# Patient Record
Sex: Male | Born: 1960 | Race: White | Hispanic: No | Marital: Married | Smoking: Never smoker
Health system: Southern US, Community
[De-identification: ages and names within clinical notes are randomized; demographics above are authoritative.]

## PROBLEM LIST (undated history)

## (undated) DIAGNOSIS — L409 Psoriasis, unspecified: Secondary | ICD-10-CM

## (undated) DIAGNOSIS — K047 Periapical abscess without sinus: Secondary | ICD-10-CM

## (undated) DIAGNOSIS — I10 Essential (primary) hypertension: Secondary | ICD-10-CM

## (undated) HISTORY — PX: OTHER SURGICAL HISTORY: SHX169

## (undated) HISTORY — DX: Essential (primary) hypertension: I10

## (undated) HISTORY — DX: Psoriasis, unspecified: L40.9

## (undated) HISTORY — PX: UPPER GI ENDOSCOPY: SHX6162

---
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
## 1988-06-14 HISTORY — PX: OTHER SURGICAL HISTORY: SHX169

## 2005-09-14 HISTORY — PX: OTHER SURGICAL HISTORY: SHX169

## 2011-11-08 ENCOUNTER — Encounter: Payer: Self-pay | Admitting: Family Medicine

## 2012-01-27 ENCOUNTER — Encounter: Payer: Self-pay | Admitting: Family Medicine

## 2012-01-27 ENCOUNTER — Ambulatory Visit (INDEPENDENT_AMBULATORY_CARE_PROVIDER_SITE_OTHER): Payer: BC Managed Care – PPO | Admitting: Family Medicine

## 2012-01-27 VITALS — BP 110/70 | HR 53 | Temp 98.3°F | Ht 74.0 in | Wt 238.4 lb

## 2012-01-27 DIAGNOSIS — I10 Essential (primary) hypertension: Secondary | ICD-10-CM | POA: Insufficient documentation

## 2012-01-27 DIAGNOSIS — L408 Other psoriasis: Secondary | ICD-10-CM

## 2012-01-27 DIAGNOSIS — L409 Psoriasis, unspecified: Secondary | ICD-10-CM

## 2012-01-27 MED ORDER — METOPROLOL TARTRATE 100 MG PO TABS: 100.0000 mg | ORAL_TABLET | Freq: Two times a day (BID) | ORAL | Status: DC

## 2012-01-27 MED ORDER — LOSARTAN POTASSIUM-HCTZ 100-12.5 MG PO TABS: 1.0000 | ORAL_TABLET | Freq: Every day | ORAL | Status: DC

## 2012-01-27 NOTE — Progress Notes (Signed)
  Subjective:    Patient ID: Richard Ware, male    DOB: 1960/03/19, 52 y.o.   MRN: [id number]  HPI New to establish.  Previous MD- Luan Pulling [facility]).  Psoriasis- new dx 1 year ago.  On [place].  Seeing Dr Sharyn Lull regularly.  Was being seen monthly but now seeing q6 months.  HTN- chronic problem, dx'd 2000 at the age of 53.  Pt was adopted so family hx is unknown.  After losing weight was able to come off meds but resumed meds w/in 2 yrs.  Currently on Norvasc, HCTZ, Cozaar, Toprol XL.  No CP, SOB, HAs, visual changes, edema.  Pt reports home BP readings will range up to 140/85 w/out any BP meds.  Very active- exercising regularly.  Pt reports he had labs done in September.   Review of Systems For ROS see HPI     Objective:   Physical Exam  Vitals reviewed. Constitutional: He is oriented to person, place, and time. He appears well-developed and well-nourished. No distress.  HENT:  Head: Normocephalic and atraumatic.  Eyes: Conjunctivae normal and EOM are normal. Pupils are equal, round, and reactive to light.  Neck: Normal range of motion. Neck supple. No thyromegaly present.  Cardiovascular: Normal rate, regular rhythm, normal heart sounds and intact distal pulses.   No murmur heard. Pulmonary/Chest: Effort normal and breath sounds normal. No respiratory distress.  Abdominal: Soft. Bowel sounds are normal. He exhibits no distension.  Musculoskeletal: He exhibits no edema.  Lymphadenopathy:    He has no cervical adenopathy.  Neurological: He is alert and oriented to person, place, and time. No cranial nerve deficit.  Skin: Skin is warm and dry.  Psychiatric: He has a normal mood and affect. His behavior is normal.          Assessment & Plan:

## 2012-01-27 NOTE — Patient Instructions (Addendum)
Follow up in 3-4 weeks to recheck BP STOP the TOPROL XL START the Metoprolol 100mg  twice daily STOP the Hydrochlorothiazide and the Losartan START the combo pill Continue the Amlodipine Call with any questions or concerns Welcome!  We're glad to have you!!!

## 2012-01-28 NOTE — Assessment & Plan Note (Signed)
New to provider, chronic for pt.  Feel that pt's BP is too tightly controlled.  Will decrease toprol in attempts to wean off entirely.  Will also combine losartan and HCTZ.  Will follow closely and continue to adjust meds prn.  Pt expressed understanding and is in agreement w/ plan.

## 2012-01-28 NOTE — Assessment & Plan Note (Signed)
New to provider, recent dx for pt.  Following w/ derm regularly.  Pt's med can have side effect of increased cholesterol and liver dysfxn.  Will check labs at next OV.  Pt expressed understanding and is in agreement w/ plan.

## 2012-02-21 ENCOUNTER — Ambulatory Visit (INDEPENDENT_AMBULATORY_CARE_PROVIDER_SITE_OTHER): Payer: BC Managed Care – PPO | Admitting: Family Medicine

## 2012-02-21 ENCOUNTER — Encounter: Payer: Self-pay | Admitting: Family Medicine

## 2012-02-21 VITALS — BP 100/80 | HR 52 | Temp 98.1°F | Ht 74.0 in | Wt 237.8 lb

## 2012-02-21 DIAGNOSIS — I10 Essential (primary) hypertension: Secondary | ICD-10-CM

## 2012-02-21 MED ORDER — METOPROLOL TARTRATE 50 MG PO TABS: ORAL_TABLET | ORAL | Status: DC

## 2012-02-21 NOTE — Patient Instructions (Addendum)
Follow up in 8 weeks to recheck BP Decrease the Metoprolol to 1 tab daily (50mg ) x2-3 weeks Then decrease to 1/2 tab daily (25mg ) x2-3 weeks Then STOP metoprolol entirely Continue the combo pill and the amlodipine Check your BP every few days or at least once weekly as we decrease meds to make sure it's not climbing Call with any questions or concerns Happy Early [place] Ouch!!!

## 2012-02-21 NOTE — Assessment & Plan Note (Signed)
BP continues to be over controlled.  Will decrease to 50mg  daily with the plan to wean to 25mg  and then stop entirely.  Will follow closely.  Pt expressed understanding and is in agreement w/ plan.

## 2012-02-21 NOTE — Progress Notes (Signed)
  Subjective:    Patient ID: Richard Ware, male    DOB: [birth year], 52 y.o.   MRN: [id number]  HPI HTN- chronic problem, pt has been aggressively medicated.  At last visit transitioned from long acting Toprol to short acting w/ goal of transitioning off med.  No CP, SOB, HAs, visual changes, edema.  Initially had some flushing when switched meds but this has resolved.   Review of Systems For ROS see HPI     Objective:   Physical Exam  Vitals reviewed. Constitutional: He is oriented to person, place, and time. He appears well-developed and well-nourished. No distress.  HENT:  Head: Normocephalic and atraumatic.  Eyes: Conjunctivae normal and EOM are normal. Pupils are equal, round, and reactive to light.  Neck: Normal range of motion. Neck supple. No thyromegaly present.  Cardiovascular: Normal rate, regular rhythm, normal heart sounds and intact distal pulses.   No murmur heard. Pulmonary/Chest: Effort normal and breath sounds normal. No respiratory distress.  Abdominal: Soft. Bowel sounds are normal. He exhibits no distension.  Musculoskeletal: He exhibits no edema.  Lymphadenopathy:    He has no cervical adenopathy.  Neurological: He is alert and oriented to person, place, and time. No cranial nerve deficit.  Skin: Skin is warm and dry.  Psychiatric: He has a normal mood and affect. His behavior is normal.          Assessment & Plan:

## 2012-02-29 ENCOUNTER — Other Ambulatory Visit: Payer: Self-pay

## 2012-04-17 ENCOUNTER — Ambulatory Visit (INDEPENDENT_AMBULATORY_CARE_PROVIDER_SITE_OTHER): Payer: BC Managed Care – PPO | Admitting: Family Medicine

## 2012-04-17 ENCOUNTER — Encounter: Payer: Self-pay | Admitting: Family Medicine

## 2012-04-17 VITALS — BP 118/80 | HR 76 | Temp 98.6°F | Ht 74.5 in | Wt 233.0 lb

## 2012-04-17 DIAGNOSIS — Z1211 Encounter for screening for malignant neoplasm of colon: Secondary | ICD-10-CM

## 2012-04-17 DIAGNOSIS — Z0000 Encounter for general adult medical examination without abnormal findings: Secondary | ICD-10-CM | POA: Insufficient documentation

## 2012-04-17 DIAGNOSIS — Z1331 Encounter for screening for depression: Secondary | ICD-10-CM

## 2012-04-17 LAB — BASIC METABOLIC PANEL
Calcium: 9.2 mg/dL (ref 8.4–10.5)
Chloride: 100 mEq/L (ref 96–112)
Creatinine, Ser: 0.9 mg/dL (ref 0.4–1.5)
Sodium: 133 mEq/L — ABNORMAL LOW (ref 135–145)

## 2012-04-17 LAB — LIPID PANEL
Cholesterol: 184 mg/dL (ref 0–200)
HDL: 37.8 mg/dL — ABNORMAL LOW (ref >39.00)
LDL Cholesterol: 131 mg/dL — ABNORMAL HIGH (ref 0–99)
Total CHOL/HDL Ratio: 5
Triglycerides: 78 mg/dL (ref 0.0–149.0)

## 2012-04-17 LAB — CBC WITH DIFFERENTIAL/PLATELET
Basophils Absolute: 0 10*3/uL (ref 0.0–0.1)
Eosinophils Absolute: 0.1 10*3/uL (ref 0.0–0.7)
Hemoglobin: 14 g/dL (ref 13.0–17.0)
Lymphocytes Relative: 27 % (ref 12.0–46.0)
MCHC: 34.2 g/dL (ref 30.0–36.0)
Neutro Abs: 3.7 10*3/uL (ref 1.4–7.7)
Neutrophils Relative %: 59.5 % (ref 43.0–77.0)
Platelets: 242 10*3/uL (ref 150.0–400.0)
RDW: 12.4 % (ref 11.5–14.6)

## 2012-04-17 LAB — HEPATIC FUNCTION PANEL
ALT: 27 U/L (ref 0–53)
Bilirubin, Direct: 0.2 mg/dL (ref 0.0–0.3)
Total Bilirubin: 1 mg/dL (ref 0.3–1.2)

## 2012-04-17 NOTE — Progress Notes (Signed)
  Subjective:    Patient ID: Richard Ware, male    DOB: 06/09/1960, 52 y.o.   MRN: [id number]  HPI CPE- no concerns.  Has never had colonoscopy.   Review of Systems Patient reports no vision/hearing changes, anorexia, fever ,adenopathy, persistant/recurrent hoarseness, swallowing issues, chest pain, palpitations, edema, persistant/recurrent cough, hemoptysis, dyspnea (rest,exertional, paroxysmal nocturnal), gastrointestinal  bleeding (melena, rectal bleeding), abdominal pain, excessive heart burn, GU symptoms (dysuria, hematuria, voiding/incontinence issues) syncope, focal weakness, memory loss, numbness & tingling, skin/hair/nail changes, depression, anxiety, abnormal bruising/bleeding, musculoskeletal symptoms/signs.     Objective:   Physical Exam BP 118/80  Pulse 76  Temp(Src) 98.6 F (37 C) (Oral)  Ht 6' 2.5" (1.892 m)  Wt 233 lb (105.688 kg)  BMI 29.52 kg/m2  SpO2 97%  General Appearance:    Alert, cooperative, no distress, appears stated age  Head:    Normocephalic, without obvious abnormality, atraumatic  Eyes:    PERRL, conjunctiva/corneas clear, EOM's intact, fundi    benign, both eyes       Ears:    Normal TM's and external ear canals, both ears  Nose:   Nares normal, septum midline, mucosa normal, no drainage   or sinus tenderness  Throat:   Lips, mucosa, and tongue normal; teeth and gums normal  Neck:   Supple, symmetrical, trachea midline, no adenopathy;       thyroid:  No enlargement/tenderness/nodules  Back:     Symmetric, no curvature, ROM normal, no CVA tenderness  Lungs:     Clear to auscultation bilaterally, respirations unlabored  Chest wall:    No tenderness or deformity  Heart:    Regular rate and rhythm, S1 and S2 normal, no murmur, rub   or gallop  Abdomen:     Soft, non-tender, bowel sounds active all four quadrants,    no masses, no organomegaly  Genitalia:    Normal male without lesion, discharge or tenderness  Rectal:    Normal tone, normal  prostate, no masses or tenderness  Extremities:   Extremities normal, atraumatic, no cyanosis or edema  Pulses:   2+ and symmetric all extremities  Skin:   Skin color, texture, turgor normal, no rashes or lesions  Lymph nodes:   Cervical, supraclavicular, and axillary nodes normal  Neurologic:   CNII-XII intact. Normal strength, sensation and reflexes      throughout          Assessment & Plan:

## 2012-04-17 NOTE — Patient Instructions (Addendum)
Follow up in 6 months to recheck BP We'll notify you of your lab results and make any changes if needed Keep up the good work!  You look great! We'll call you with your GI appt Happy Belated Birthday!

## 2012-04-17 NOTE — Assessment & Plan Note (Signed)
New.  Pt's PE WNL.  Check labs.  Anticipatory guidance provided.  Refer for colonoscopy.

## 2012-04-23 ENCOUNTER — Encounter: Payer: Self-pay | Admitting: Family Medicine

## 2012-04-23 NOTE — Telephone Encounter (Signed)
Please advise.//AB/CMA 

## 2012-04-24 ENCOUNTER — Encounter: Payer: Self-pay | Admitting: Family Medicine

## 2012-04-24 NOTE — Telephone Encounter (Signed)
Norvasc Last OV 04-17-12, no refill history .Please advise

## 2012-04-26 ENCOUNTER — Other Ambulatory Visit: Payer: Self-pay | Admitting: Family Medicine

## 2012-04-26 MED ORDER — AMLODIPINE BESYLATE 10 MG PO TABS: 10.0000 mg | ORAL_TABLET | Freq: Every day | ORAL | Status: DC

## 2012-04-26 MED ORDER — LOSARTAN POTASSIUM-HCTZ 100-12.5 MG PO TABS: 1.0000 | ORAL_TABLET | Freq: Every day | ORAL | Status: DC

## 2012-05-15 ENCOUNTER — Encounter: Payer: Self-pay | Admitting: Gastroenterology

## 2012-06-24 ENCOUNTER — Ambulatory Visit (AMBULATORY_SURGERY_CENTER): Payer: Self-pay | Admitting: *Deleted

## 2012-06-24 VITALS — Ht 75.0 in | Wt 228.2 lb

## 2012-06-24 DIAGNOSIS — Z1211 Encounter for screening for malignant neoplasm of colon: Secondary | ICD-10-CM

## 2012-06-24 MED ORDER — NA SULFATE-K SULFATE-MG SULF 17.5-3.13-1.6 GM/177ML PO SOLN: ORAL | Status: DC

## 2012-07-09 ENCOUNTER — Encounter: Payer: BC Managed Care – PPO | Admitting: Gastroenterology

## 2012-07-27 ENCOUNTER — Telehealth: Payer: Self-pay | Admitting: Gastroenterology

## 2012-07-27 NOTE — Telephone Encounter (Signed)
Patient's Suprep is already at the pharmacy per [facility]. I have asked that they go ahead and fill the script for the patient. I have also given updated instructions as per patient request.

## 2012-07-29 ENCOUNTER — Encounter: Payer: Self-pay | Admitting: Family Medicine

## 2012-07-30 MED ORDER — AMLODIPINE BESYLATE 10 MG PO TABS: 10.0000 mg | ORAL_TABLET | Freq: Every day | ORAL | Status: DC

## 2012-07-30 MED ORDER — LOSARTAN POTASSIUM-HCTZ 100-12.5 MG PO TABS: 1.0000 | ORAL_TABLET | Freq: Every day | ORAL | Status: DC

## 2012-08-04 ENCOUNTER — Encounter: Payer: Self-pay | Admitting: Gastroenterology

## 2012-08-04 ENCOUNTER — Ambulatory Visit (AMBULATORY_SURGERY_CENTER): Payer: BC Managed Care – PPO | Admitting: Gastroenterology

## 2012-08-04 VITALS — BP 128/82 | HR 49 | Temp 97.1°F | Resp 13 | Ht 75.0 in | Wt 228.0 lb

## 2012-08-04 DIAGNOSIS — Z1211 Encounter for screening for malignant neoplasm of colon: Secondary | ICD-10-CM

## 2012-08-04 DIAGNOSIS — D126 Benign neoplasm of colon, unspecified: Secondary | ICD-10-CM

## 2012-08-04 MED ORDER — SODIUM CHLORIDE 0.9 % IV SOLN: 500.0000 mL | INTRAVENOUS | Status: DC

## 2012-08-04 NOTE — Op Note (Signed)
[facility] [address] [place], [address]   COLONOSCOPY PROCEDURE REPORT  PATIENT: Richard, Ware  MR#: [id number] BIRTHDATE: 07-May-1960 , 52  yrs. old GENDER: Male ENDOSCOPIST: Louis Meckel, MD REFERRED QI:[id number] Beverely Low, M.D. PROCEDURE DATE:  08/04/2012 PROCEDURE:   Colonoscopy with snare polypectomy and Colonoscopy with cold biopsy polypectomy ASA CLASS:   Class I INDICATIONS:average risk screening. MEDICATIONS: MAC sedation, administered by CRNA and Propofol (Diprivan) 360 mg IV  DESCRIPTION OF PROCEDURE:   After the risks benefits and alternatives of the procedure were thoroughly explained, informed consent was obtained.  A digital rectal exam revealed no abnormalities of the rectum.   The LB [id number]  endoscope was introduced through the anus and advanced to the cecum, which was identified by both the appendix and ileocecal valve. No adverse events experienced.   The quality of the prep was excellent using Suprep  The instrument was then slowly withdrawn as the colon was fully examined.      COLON FINDINGS: A sessile polyp measuring 2-3 mm in size was found at the cecum.  A polypectomy was performed with a cold snare.  The resection was complete and the polyp tissue was completely retrieved.   A sessile polyp measuring 3 mm in size was found in the proximal transverse colon.  A polypectomy was performed.   A sessile polyp measuring 2 mm in size was found in the descending colon.  A polypectomy was performed with cold forceps.   A sessile polyp measuring 15 mm in size was found in the proximal sigmoid colon.  A polypectomy was performed using snare cautery.  The resection was complete and the polyp tissue was completely retrieved.  Retroflexed views revealed no abnormalities. The time to cecum=3 minutes 52 seconds.  Withdrawal time=12 minutes 57 seconds.  The scope was withdrawn and the procedure completed. COMPLICATIONS: There  were no complications.  ENDOSCOPIC IMPRESSION: 1.   Sessile polyp measuring 2-3 mm in size was found at the cecum; polypectomy was performed with a cold snare 2.   Sessile polyp measuring 3 mm in size was found in the proximal transverse colon; polypectomy was performed 3.   Sessile polyp measuring 2 mm in size was found in the descending colon; polypectomy was performed with cold forceps 4.   Sessile polyp measuring 15 mm in size was found in the proximal sigmoid colon; polypectomy was performed using snare cautery  RECOMMENDATIONS: If the polyp(s) removed today are proven to be adenomatous (pre-cancerous) polyps, you will need a colonoscopy in 3 years. Otherwise you should continue to follow colorectal cancer screening guidelines for "routine risk" patients with a colonoscopy in 10 years.  You will receive a letter within 1-2 weeks with the results of your biopsy as well as final recommendations.  Please call my office if you have not received a letter after 3 weeks.   eSigned:  Louis Meckel, MD 08/04/2012 2:51 PM   cc:   PATIENT NAME:  Richard, Ware MR#: [id number]

## 2012-08-04 NOTE — Patient Instructions (Addendum)
Discharge instructions given with verbal understanding. Handouts on polyps given. Resume previous medications. YOU HAD AN ENDOSCOPIC PROCEDURE TODAY AT THE [facility]: Refer to the procedure report that was given to you for any specific questions about what was found during the examination.  If the procedure report does not answer your questions, please call your gastroenterologist to clarify.  If you requested that your care partner not be given the details of your procedure findings, then the procedure report has been included in a sealed envelope for you to review at your convenience later.  YOU SHOULD EXPECT: Some feelings of bloating in the abdomen. Passage of more gas than usual.  Walking can help get rid of the air that was put into your GI tract during the procedure and reduce the bloating. If you had a lower endoscopy (such as a colonoscopy or flexible sigmoidoscopy) you may notice spotting of blood in your stool or on the toilet paper. If you underwent a bowel prep for your procedure, then you may not have a normal bowel movement for a few days.  DIET: Your first meal following the procedure should be a light meal and then it is ok to progress to your normal diet.  A half-sandwich or bowl of soup is an example of a good first meal.  Heavy or fried foods are harder to digest and may make you feel nauseous or bloated.  Likewise meals heavy in dairy and vegetables can cause extra gas to form and this can also increase the bloating.  Drink plenty of fluids but you should avoid alcoholic beverages for 24 hours.  ACTIVITY: Your care partner should take you home directly after the procedure.  You should plan to take it easy, moving slowly for the rest of the day.  You can resume normal activity the day after the procedure however you should NOT DRIVE or use heavy machinery for 24 hours (because of the sedation medicines used during the test).    SYMPTOMS TO REPORT IMMEDIATELY: A  gastroenterologist can be reached at any hour.  During normal business hours, 8:30 AM to 5:00 PM Monday through Friday, call [phone].  After hours and on weekends, please call the GI answering service at [phone] who will take a message and have the physician on call contact you.   Following lower endoscopy (colonoscopy or flexible sigmoidoscopy):  Excessive amounts of blood in the stool  Significant tenderness or worsening of abdominal pains  Swelling of the abdomen that is new, acute  Fever of 100F or higher  FOLLOW UP: If any biopsies were taken you will be contacted by phone or by letter within the next 1-3 weeks.  Call your gastroenterologist if you have not heard about the biopsies in 3 weeks.  Our staff will call the home number listed on your records the next business day following your procedure to check on you and address any questions or concerns that you may have at that time regarding the information given to you following your procedure. This is a courtesy call and so if there is no answer at the home number and we have not heard from you through the emergency physician on call, we will assume that you have returned to your regular daily activities without incident.  SIGNATURES/CONFIDENTIALITY: You and/or your care partner have signed paperwork which will be entered into your electronic medical record.  These signatures attest to the fact that that the information above on your After Visit Summary has   been reviewed and is understood.  Full responsibility of the confidentiality of this discharge information lies with you and/or your care-partner.  

## 2012-08-04 NOTE — Progress Notes (Signed)
A/ox3 pleased with MAC, report to Celia RN 

## 2012-08-04 NOTE — Progress Notes (Signed)
Called to room to assist during endoscopic procedure.  Patient ID and intended procedure confirmed with present staff. Received instructions for my participation in the procedure from the performing physician.  

## 2012-08-04 NOTE — Progress Notes (Signed)
Patient did not experience any of the following events: a burn prior to discharge; a fall within the facility; wrong site/side/patient/procedure/implant event; or a hospital transfer or hospital admission upon discharge from the facility. (G8907) Patient did not have preoperative order for IV antibiotic SSI prophylaxis. (G8918)  

## 2012-08-11 ENCOUNTER — Encounter: Payer: Self-pay | Admitting: Gastroenterology

## 2012-09-08 ENCOUNTER — Encounter: Payer: Self-pay | Admitting: Family Medicine

## 2012-10-13 ENCOUNTER — Ambulatory Visit (INDEPENDENT_AMBULATORY_CARE_PROVIDER_SITE_OTHER): Payer: BC Managed Care – PPO

## 2012-10-13 DIAGNOSIS — Z23 Encounter for immunization: Secondary | ICD-10-CM

## 2013-02-04 ENCOUNTER — Encounter: Payer: Self-pay | Admitting: Family Medicine

## 2013-04-21 ENCOUNTER — Telehealth: Payer: Self-pay

## 2013-04-21 NOTE — Telephone Encounter (Signed)
Left message for call back Non-identifiable   Flu- 10/13/12 Td- 01/15/04 CCS- 08/04/12- adenomatous polyps, repeat in 3 years (07/2015).   PSA- 04/17/12   0.40

## 2013-04-21 NOTE — Telephone Encounter (Signed)
Patient called to return your phone call °

## 2013-04-22 ENCOUNTER — Encounter: Payer: Self-pay | Admitting: Family Medicine

## 2013-04-22 ENCOUNTER — Ambulatory Visit (INDEPENDENT_AMBULATORY_CARE_PROVIDER_SITE_OTHER): Payer: BC Managed Care – PPO | Admitting: Family Medicine

## 2013-04-22 VITALS — BP 116/78 | HR 64 | Temp 98.5°F | Resp 16 | Ht 74.5 in | Wt 212.2 lb

## 2013-04-22 DIAGNOSIS — Z0000 Encounter for general adult medical examination without abnormal findings: Secondary | ICD-10-CM

## 2013-04-22 LAB — HEPATIC FUNCTION PANEL
ALK PHOS: 74 U/L (ref 39–117)
ALT: 24 U/L (ref 0–53)
AST: 32 U/L (ref 0–37)
Albumin: 4.3 g/dL (ref 3.5–5.2)
BILIRUBIN DIRECT: 0.2 mg/dL (ref 0.0–0.3)
BILIRUBIN TOTAL: 1.3 mg/dL — AB (ref 0.3–1.2)
Total Protein: 7 g/dL (ref 6.0–8.3)

## 2013-04-22 LAB — LIPID PANEL
CHOL/HDL RATIO: 4
Cholesterol: 190 mg/dL (ref 0–200)
HDL: 52.1 mg/dL (ref >39.00)
LDL CALC: 128 mg/dL — AB (ref 0–99)
Triglycerides: 49 mg/dL (ref 0.0–149.0)
VLDL: 9.8 mg/dL (ref 0.0–40.0)

## 2013-04-22 LAB — BASIC METABOLIC PANEL
BUN: 11 mg/dL (ref 6–23)
CHLORIDE: 101 meq/L (ref 96–112)
CO2: 29 mEq/L (ref 19–32)
CREATININE: 0.8 mg/dL (ref 0.4–1.5)
Calcium: 9.5 mg/dL (ref 8.4–10.5)
GFR: 101.58 mL/min (ref >60.00)
Glucose, Bld: 89 mg/dL (ref 70–99)
POTASSIUM: 3.8 meq/L (ref 3.5–5.1)
SODIUM: 137 meq/L (ref 135–145)

## 2013-04-22 LAB — CBC WITH DIFFERENTIAL/PLATELET
BASOS ABS: 0 10*3/uL (ref 0.0–0.1)
Basophils Relative: 0.6 % (ref 0.0–3.0)
EOS ABS: 0.3 10*3/uL (ref 0.0–0.7)
Eosinophils Relative: 4.3 % (ref 0.0–5.0)
HCT: 40.8 % (ref 39.0–52.0)
Hemoglobin: 13.7 g/dL (ref 13.0–17.0)
LYMPHS PCT: 32.4 % (ref 12.0–46.0)
Lymphs Abs: 2.1 10*3/uL (ref 0.7–4.0)
MCHC: 33.7 g/dL (ref 30.0–36.0)
MCV: 94.5 fl (ref 78.0–100.0)
MONO ABS: 0.7 10*3/uL (ref 0.1–1.0)
Monocytes Relative: 11 % (ref 3.0–12.0)
NEUTROS PCT: 51.7 % (ref 43.0–77.0)
Neutro Abs: 3.3 10*3/uL (ref 1.4–7.7)
PLATELETS: 241 10*3/uL (ref 150.0–400.0)
RBC: 4.32 Mil/uL (ref 4.22–5.81)
RDW: 12.9 % (ref 11.5–14.6)
WBC: 6.3 10*3/uL (ref 4.5–10.5)

## 2013-04-22 LAB — TSH: TSH: 1.46 u[IU]/mL (ref 0.35–5.50)

## 2013-04-22 LAB — PSA: PSA: 1.76 ng/mL (ref 0.10–4.00)

## 2013-04-22 NOTE — Assessment & Plan Note (Signed)
Pt's PE WNL.  UTD on colonoscopy.  Check labs.  Anticipatory guidance provided.

## 2013-04-22 NOTE — Progress Notes (Signed)
   Subjective:    Patient ID: Richard Ware, male    DOB: [birth year], 53 y.o.   MRN: [id number]  HPI CPE- no concerns today.  UTD on colonoscopy   Review of Systems Patient reports no vision/hearing changes, anorexia, fever ,adenopathy, persistant/recurrent hoarseness, swallowing issues, chest pain, palpitations, edema, persistant/recurrent cough, hemoptysis, dyspnea (rest,exertional, paroxysmal nocturnal), gastrointestinal  bleeding (melena, rectal bleeding), abdominal pain, excessive heart burn, GU symptoms (dysuria, hematuria, voiding/incontinence issues) syncope, focal weakness, memory loss, numbness & tingling, skin/hair/nail changes, depression, anxiety, abnormal bruising/bleeding, musculoskeletal symptoms/signs.     Objective:   Physical Exam BP 116/78  Pulse 64  Temp(Src) 98.5 F (36.9 C) (Oral)  Resp 16  Ht 6' 2.5" (1.892 m)  Wt 212 lb 4 oz (96.276 kg)  BMI 26.90 kg/m2  SpO2 98%  General Appearance:    Alert, cooperative, no distress, appears stated age  Head:    Normocephalic, without obvious abnormality, atraumatic  Eyes:    PERRL, conjunctiva/corneas clear, EOM's intact, fundi    benign, both eyes       Ears:    Normal TM's and external ear canals, both ears  Nose:   Nares normal, septum midline, mucosa normal, no drainage   or sinus tenderness  Throat:   Lips, mucosa, and tongue normal; teeth and gums normal  Neck:   Supple, symmetrical, trachea midline, no adenopathy;       thyroid:  No enlargement/tenderness/nodules  Back:     Symmetric, no curvature, ROM normal, no CVA tenderness  Lungs:     Clear to auscultation bilaterally, respirations unlabored  Chest wall:    No tenderness or deformity  Heart:    Regular rate and rhythm, S1 and S2 normal, no murmur, rub   or gallop  Abdomen:     Soft, non-tender, bowel sounds active all four quadrants,    no masses, no organomegaly  Genitalia:    Normal male without lesion, discharge or tenderness  Rectal:    Normal  tone, normal prostate, no masses or tenderness  Extremities:   Extremities normal, atraumatic, no cyanosis or edema  Pulses:   2+ and symmetric all extremities  Skin:   Skin color, texture, turgor normal, no rashes or lesions  Lymph nodes:   Cervical, supraclavicular, and axillary nodes normal  Neurologic:   CNII-XII intact. Normal strength, sensation and reflexes      throughout          Assessment & Plan:

## 2013-04-22 NOTE — Patient Instructions (Signed)
Follow up in 1 year or as needed We'll notify you of your lab results and make any changes if needed Keep up the good work!  You look great! Call with any questions or concerns Happy Belated Birthday!!! 

## 2013-04-22 NOTE — Progress Notes (Signed)
Pre visit review using our clinic review tool, if applicable. No additional management support is needed unless otherwise documented below in the visit note. 

## 2013-04-26 NOTE — Telephone Encounter (Signed)
Unable to reach pre visit.  

## 2013-05-17 ENCOUNTER — Other Ambulatory Visit: Payer: Self-pay | Admitting: Family Medicine

## 2013-05-17 NOTE — Telephone Encounter (Signed)
Med filled.  

## 2013-07-13 ENCOUNTER — Encounter: Payer: Self-pay | Admitting: Family Medicine

## 2013-07-14 ENCOUNTER — Encounter: Payer: Self-pay | Admitting: Family Medicine

## 2013-07-14 ENCOUNTER — Ambulatory Visit (INDEPENDENT_AMBULATORY_CARE_PROVIDER_SITE_OTHER): Payer: BC Managed Care – PPO | Admitting: Family Medicine

## 2013-07-14 VITALS — BP 142/84 | HR 71 | Temp 99.1°F | Resp 16 | Wt 213.1 lb

## 2013-07-14 DIAGNOSIS — L600 Ingrowing nail: Secondary | ICD-10-CM

## 2013-07-14 DIAGNOSIS — L03039 Cellulitis of unspecified toe: Secondary | ICD-10-CM

## 2013-07-14 DIAGNOSIS — L03031 Cellulitis of right toe: Secondary | ICD-10-CM

## 2013-07-14 MED ORDER — AMOXICILLIN-POT CLAVULANATE 875-125 MG PO TABS: 1.0000 | ORAL_TABLET | Freq: Two times a day (BID) | ORAL | Status: DC

## 2013-07-14 NOTE — Progress Notes (Signed)
   Subjective:    Patient ID: Monish Haliburton, male    DOB: July 11, 1960, 53 y.o.   MRN: [id number]  HPI R great toe infxn- pulled some dry skin off the edge of the nail and then subsequently stubbed toe 1 week ago.  Now oozing, painful.   Review of Systems For ROS see HPI     Objective:   Physical Exam  Vitals reviewed. Constitutional: He appears well-developed and well-nourished. No distress.  Skin: Skin is warm and dry.  R great toe nail w/ area of hematoma and fluctuance along medial nail edge w/ ingrown nail.  Area prepped w/ alcohol and punctured w/ 23 gauge needle- copious blood and minimal pus expressed, sent for culture.          Assessment & Plan:

## 2013-07-14 NOTE — Patient Instructions (Signed)
Follow up as needed Start the Augmentin twice daily Keep area clean, dry, and covered Peroxide twice daily, pat dry, apply neosporin, cover We'll call you with your podiatry appt Call with any questions or concerns Happy 4th of July!

## 2013-07-14 NOTE — Progress Notes (Signed)
Pre visit review using our clinic review tool, if applicable. No additional management support is needed unless otherwise documented below in the visit note. 

## 2013-07-14 NOTE — Assessment & Plan Note (Signed)
New.  Wound culture sent.  Pt started on Augmentin to avoid sun sensitivity w/ Doxy as they are going out of town to [place] this weekend.  Reviewed wound care and red flags that should prompt immediate attention.

## 2013-07-14 NOTE — Assessment & Plan Note (Signed)
New.  Pt referred to podiatry for tx.  Pt unable to have appt until next week due to travel plans.  Reviewed supportive care and red flags that should prompt return.  Pt expressed understanding and is in agreement w/ plan.

## 2013-07-18 LAB — WOUND CULTURE
Gram Stain: NONE SEEN
Gram Stain: NONE SEEN

## 2013-07-19 ENCOUNTER — Encounter: Payer: Self-pay | Admitting: Family Medicine

## 2013-07-19 ENCOUNTER — Telehealth: Payer: Self-pay

## 2013-07-19 MED ORDER — DOXYCYCLINE HYCLATE 100 MG PO TABS: 100.0000 mg | ORAL_TABLET | Freq: Two times a day (BID) | ORAL | Status: DC

## 2013-07-19 MED ORDER — CIPROFLOXACIN HCL 500 MG PO TABS: 500.0000 mg | ORAL_TABLET | Freq: Two times a day (BID) | ORAL | Status: DC

## 2013-07-19 NOTE — Telephone Encounter (Signed)
Message copied by Rudene Anda on Mon Jul 19, 2013 11:47 AM ------      Message from: Midge Minium      Created: Mon Jul 19, 2013  8:09 AM       Your wound shows resistant bacteria.  Based on this, we need to STOP the Augmentin and SWITCH to Doxycycline 100mg  twice daily x10 days.  This medication will make you sun sensitive and more likely to burn- please protect yourself! ------

## 2013-07-19 NOTE — Addendum Note (Signed)
Addended by: Midge Minium on: 07/19/2013 08:10 AM   Modules accepted: Orders

## 2013-07-19 NOTE — Telephone Encounter (Signed)
Results and provider's instructions reviewed with Mr. Ouch.  He stated understanding.  No questions or concerns at this time.    Doxycycline Rx sent to [id number] - [place], [address] AT [address].  Pt is aware.

## 2013-07-21 ENCOUNTER — Ambulatory Visit: Payer: Self-pay | Admitting: Family Medicine

## 2013-07-30 ENCOUNTER — Ambulatory Visit: Payer: Self-pay

## 2013-08-03 ENCOUNTER — Ambulatory Visit (INDEPENDENT_AMBULATORY_CARE_PROVIDER_SITE_OTHER): Payer: BC Managed Care – PPO | Admitting: Podiatry

## 2013-08-03 ENCOUNTER — Encounter: Payer: Self-pay | Admitting: Podiatry

## 2013-08-03 VITALS — BP 121/69 | HR 69 | Resp 16 | Ht 75.0 in | Wt 204.0 lb

## 2013-08-03 DIAGNOSIS — L600 Ingrowing nail: Secondary | ICD-10-CM

## 2013-08-03 MED ORDER — NEOMYCIN-POLYMYXIN-HC 3.5-10000-1 OT SOLN: OTIC | Status: DC

## 2013-08-03 NOTE — Progress Notes (Signed)
   Subjective:    Patient ID: Richard Ware, male    DOB: Oct 11, 1960, 53 y.o.   MRN: [id number]  HPI Comments: "I have an infected toe"  Patient c/o aching 1st toe right, lateral border, for few weeks. The area is red, swollen and draining. PCP put on antibiotic and cultured. Once culture come back switched to cipro-now complete. Not better.  Toe Pain       Review of Systems  Skin: Positive for wound.  All other systems reviewed and are negative.      Objective:   Physical Exam: I have reviewed his past medical history medications allergies surgeries social history and review of systems. Pulses are strongly palpable bilateral. Neurologic sensorium is intact per since once the monofilament. Deep tendon reflexes are intact bilateral. Muscle strength is 5 over 5 dorsiflexors plantar flexors inverters everters all intrinsic musculature is intact. Cutaneous evaluation demonstrates sharp incurvated nail margin along the fibular border of the hallux right.        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  Plan: Discussed etiology pathology conservative versus surgical therapies. At this point chemical matrixectomy was performed after excision of granulation tissue and fibular nail border. This was performed under local anesthetic and he tolerated the procedure well he was her soaking in Epsom salts and water soaks followup with me in one week.

## 2013-08-03 NOTE — Patient Instructions (Signed)

## 2013-08-17 ENCOUNTER — Ambulatory Visit (INDEPENDENT_AMBULATORY_CARE_PROVIDER_SITE_OTHER): Payer: BC Managed Care – PPO | Admitting: Podiatry

## 2013-08-17 ENCOUNTER — Encounter: Payer: Self-pay | Admitting: Podiatry

## 2013-08-17 DIAGNOSIS — L600 Ingrowing nail: Secondary | ICD-10-CM

## 2013-08-17 NOTE — Progress Notes (Signed)
He presents today for followup of her matrixectomy tibial border hallux right. He denies fever chills nausea vomiting muscle aches pains continues to soak on a regular basis.  Objective: Vital signs are stable he is alert and oriented x3 there is no erythema edema saline is drainage or odor appears to be healing quite nicely.  Assessment: Well-healing surgical toe matrixectomy hallux right fibular border.  Plan: Discontinue Betadine start with Epsom salts in warm water soaks covered in the day and leave open at night.

## 2013-10-18 ENCOUNTER — Ambulatory Visit: Payer: BC Managed Care – PPO

## 2013-10-18 DIAGNOSIS — Z23 Encounter for immunization: Secondary | ICD-10-CM

## 2013-11-19 ENCOUNTER — Encounter: Payer: Self-pay | Admitting: Family Medicine

## 2013-11-19 MED ORDER — AMLODIPINE BESYLATE 10 MG PO TABS: ORAL_TABLET | ORAL | Status: DC

## 2013-11-19 MED ORDER — LOSARTAN POTASSIUM-HCTZ 100-12.5 MG PO TABS: ORAL_TABLET | ORAL | Status: DC

## 2013-11-19 NOTE — Telephone Encounter (Signed)
meds filled

## 2014-02-11 ENCOUNTER — Encounter: Payer: Self-pay | Admitting: Family Medicine

## 2014-03-03 ENCOUNTER — Encounter: Payer: Self-pay | Admitting: Family Medicine

## 2014-03-03 ENCOUNTER — Ambulatory Visit (INDEPENDENT_AMBULATORY_CARE_PROVIDER_SITE_OTHER): Payer: BLUE CROSS/BLUE SHIELD | Admitting: Family Medicine

## 2014-03-03 VITALS — BP 118/70 | HR 67 | Temp 98.3°F | Resp 16 | Wt 211.2 lb

## 2014-03-03 DIAGNOSIS — R1030 Lower abdominal pain, unspecified: Secondary | ICD-10-CM

## 2014-03-03 DIAGNOSIS — M79621 Pain in right upper arm: Secondary | ICD-10-CM

## 2014-03-03 DIAGNOSIS — R1031 Right lower quadrant pain: Secondary | ICD-10-CM | POA: Insufficient documentation

## 2014-03-03 MED ORDER — MELOXICAM 15 MG PO TABS: 15.0000 mg | ORAL_TABLET | Freq: Every day | ORAL | Status: DC

## 2014-03-03 NOTE — Patient Instructions (Signed)
Follow up as needed We'll call you with your Ortho appt Start the Mobic once daily for inflammation- take w/ food You can add tylenol as needed but no additional ibuprofen, advil, motrin, etc We'll call you with your general surgery appt regarding the hernia Call with any questions or concerns Hang in there!

## 2014-03-03 NOTE — Assessment & Plan Note (Signed)
New.  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  Discussed watchful waiting vs surgical consult.  Pt interested in consult to discuss options.  Reviewed sxs of strangulation/incarceraton and that these are medical emergencies.  Pt expressed understanding and is in agreement w/ plan.

## 2014-03-03 NOTE — Progress Notes (Signed)
   Subjective:    Patient ID: Richard Ware, male    DOB: [birth year], 54 y.o.   MRN: [id number]  HPI Arm pain- L upper arm pain.  1st started 2 yrs ago but has worsened x3-4 months.  Pt is an avid golfer and has pain w/ swing.  Pain to abduct arm to 90 degrees, put arms behind head to do crunches.  No radiation of pain into shoulder or neck.  Personal trainer doesn't feel it is rotator cuff.  Pain is intermittent.  Pain is now waking pt from sleep at night.  Groin pain- pt was moving in December and noticed pain in R groin and 'a little bit of a bulge'.  Was able to reduce bulge.  Last weekend had increased discomfort after walking.   Review of Systems For ROS see HPI     Objective:   Physical Exam  Constitutional: He is oriented to person, place, and time. He appears well-developed and well-nourished. No distress.  Abdominal: Soft. He exhibits mass (bulge on R side just superior to pelvic arch w/ small, palpable muscle defect). He exhibits no distension. There is no tenderness.  Musculoskeletal: Normal range of motion.  Full ROM of L shoulder, no impingement signs.  No TTP over head of biceps.  No TTP over bursa.  Neurological: He is alert and oriented to person, place, and time. He has normal reflexes. Coordination normal.  Vitals reviewed.         Assessment & Plan:

## 2014-03-03 NOTE — Assessment & Plan Note (Signed)
New.  Given pt's full ROM and lack of impingement signs on PE, doubt rotator cuff issue.  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  Start daily Mobic for pain and inflammation and refer to Ortho.  Pt expressed understanding and is in agreement w/ plan.

## 2014-03-03 NOTE — Progress Notes (Signed)
Pre visit review using our clinic review tool, if applicable. No additional management support is needed unless otherwise documented below in the visit note. 

## 2014-03-15 ENCOUNTER — Other Ambulatory Visit (INDEPENDENT_AMBULATORY_CARE_PROVIDER_SITE_OTHER): Payer: Self-pay | Admitting: Surgery

## 2014-03-15 NOTE — H&P (Signed)
Richard Ware 03/15/2014 11:43 AM Location: [name] Patient #: [id number] DOB: 24-May-1960 Married / Language: Richard Ware / Race: White Male History of Present Illness Richard Hector MD; 03/15/2014 12:07 PM) The patient is a 54 year old male who presents with an inguinal hernia. Patient sent by primary care physician, Dr. Annye Ware, for concerns of RIGHT inguinal hernia. Pleasant active male. Uses treadmill and weights on a daily basis. Avid golfer. Noted some RIGHT groin bulging after opening moving into a house in December. It is reducible. However it intermittently pops out. Can cause some pain and tingling. Concerned him. Discuss with his primary care physician. Concern of inguinal hernia. Surgical consultation requested. Patient had open appendectomy but no other abdominal surgeries. Normally has bowel movement every day. Had colonoscopy two years ago that noted some small tubular adenomas. Three-year follow-up recommended. He cannot recall that. We went and printed out the colonoscopy and path reports for him. Patient can walk 5 miles without difficulty. No history of Crohn's or colitis. No history of MRSA skin infections. Other Problems Richard Mage Spill[place], MA; 03/15/2014 11:43 AM) High blood pressure  Past Surgical History Richard Mage [place], MA; 03/15/2014 11:43 AM) Appendectomy  Diagnostic Studies History Richard Mage [place], MA; 03/15/2014 11:43 AM) Colonoscopy 1-5 years ago  Allergies Richard Mage Spill[place], MA; 03/15/2014 11:45 AM) No Known Drug Allergies 03/15/2014  Medication History (Richard Spillers, MA; 03/15/2014 11:45 AM) AmLODIPine Besylate (10MG  Tablet, Oral) Active. Acitretin (25MG  Capsule, Oral) Active. Losartan Potassium-HCTZ (100-12.5MG  Tablet, Oral) Active. Meloxicam (15MG  Tablet, Oral) Active. Medications Reconciled  Social History Richard Hector, MD; 03/15/2014 12:09 PM) Alcohol use Occasional alcohol use. Caffeine use Coffee. No drug  use Tobacco use Never smoker. originally from a [place]. Has lived in [place] and [place], [place]. Lives in [place] since 2008  Family History Richard Ware, [place]; 03/15/2014 11:43 AM) Family history unknown First Degree Relatives     Review of Systems Richard Mage [name]; 03/15/2014 11:43 AM) General Not Present- Appetite Loss, Chills, Fatigue, Fever, Night Sweats, Weight Gain and Weight Loss. Skin Not Present- Change in Wart/Mole, Dryness, Hives, Jaundice, New Lesions, Non-Healing Wounds, Rash and Ulcer. HEENT Not Present- Earache, Hearing Loss, Hoarseness, Nose Bleed, Oral Ulcers, Ringing in the Ears, Seasonal Allergies, Sinus Pain, Sore Throat, Visual Disturbances, Wears glasses/contact lenses and Yellow Eyes. Respiratory Not Present- Bloody sputum, Chronic Cough, Difficulty Breathing, Snoring and Wheezing. Breast Not Present- Breast Mass, Breast Pain, Nipple Discharge and Skin Changes. Cardiovascular Not Present- Chest Pain, Difficulty Breathing Lying Down, Leg Cramps, Palpitations, Rapid Heart Rate, Shortness of Breath and Swelling of Extremities. Gastrointestinal Not Present- Abdominal Pain, Bloating, Bloody Stool, Change in Bowel Habits, Chronic diarrhea, Constipation, Difficulty Swallowing, Excessive gas, Gets full quickly at meals, Hemorrhoids, Indigestion, Nausea, Rectal Pain and Vomiting. Male Genitourinary Not Present- Blood in Urine, Change in Urinary Stream, Frequency, Impotence, Nocturia, Painful Urination, Urgency and Urine Leakage. Musculoskeletal Not Present- Back Pain, Joint Pain, Joint Stiffness, Muscle Pain, Muscle Weakness and Swelling of Extremities. Neurological Not Present- Decreased Memory, Fainting, Headaches, Numbness, Seizures, Tingling, Tremor, Trouble walking and Weakness. Psychiatric Not Present- Anxiety, Bipolar, Change in Sleep Pattern, Depression, Fearful and Frequent crying. Endocrine Not Present- Cold Intolerance,  Excessive Hunger, Hair Changes, Heat Intolerance, Hot flashes and New Diabetes. Hematology Not Present- Easy Bruising, Excessive bleeding, Gland problems, HIV and Persistent Infections.  Vitals (Richard Spillers MA; 03/15/2014 11:45 AM) 03/15/2014 11:44 AM Weight: 211 lb Height: 75in Body Surface Area: 2.25 m Body Mass Index: 26.37 kg/m Pulse: 58 (Regular)  BP: 138/82 (  Sitting, Left Arm, Standard)     Physical Exam Richard Hector MD; 03/15/2014 12:05 PM)  General Mental Status-Alert. General Appearance-Not in acute distress, Not Sickly. Orientation-Oriented X3. Hydration-Well hydrated. Voice-Normal.  Integumentary Global Assessment Upon inspection and palpation of skin surfaces of the - Axillae: non-tender, no inflammation or ulceration, no drainage. and Distribution of scalp and body hair is normal. General Characteristics Temperature - normal warmth is noted.  Head and Neck Head-normocephalic, atraumatic with no lesions or palpable masses. Face Global Assessment - atraumatic, no absence of expression. Neck Global Assessment - no abnormal movements, no bruit auscultated on the right, no bruit auscultated on the left, no decreased range of motion, non-tender. Trachea-midline. Thyroid Gland Characteristics - non-tender.  Eye Eyeball - Left-Extraocular movements intact, No Nystagmus. Eyeball - Right-Extraocular movements intact, No Nystagmus. Cornea - Left-No Hazy. Cornea - Right-No Hazy. Sclera/Conjunctiva - Left-No scleral icterus, No Discharge. Sclera/Conjunctiva - Right-No scleral icterus, No Discharge. Pupil - Left-Direct reaction to light normal. Pupil - Right-Direct reaction to light normal.  ENMT Ears Pinna - Left - no drainage observed, no generalized tenderness observed. Right - no drainage observed, no generalized tenderness observed. Nose and Sinuses External Inspection of the Nose - no destructive lesion observed.  Inspection of the nares - Left - quiet respiration. Right - quiet respiration. Mouth and Throat Lips - Upper Lip - no fissures observed, no pallor noted. Lower Lip - no fissures observed, no pallor noted. Nasopharynx - no discharge present. Oral Cavity/Oropharynx - Tongue - no dryness observed. Oral Mucosa - no cyanosis observed. Hypopharynx - no evidence of airway distress observed.  Chest and Lung Exam Inspection Movements - Normal and Symmetrical. Accessory muscles - No use of accessory muscles in breathing. Palpation Palpation of the chest reveals - Non-tender. Auscultation Breath sounds - Normal and Clear.  Cardiovascular Auscultation Rhythm - Regular. Murmurs & Other Heart Sounds - Auscultation of the heart reveals - No Murmurs and No Systolic Clicks.  Abdomen Inspection Inspection of the abdomen reveals - No Visible peristalsis and No Abnormal pulsations. Umbilicus - No Bleeding, No Urine drainage. Palpation/Percussion Palpation and Percussion of the abdomen reveal - Soft, Non Tender, No Rebound tenderness, No Rigidity (guarding) and No Cutaneous hyperesthesia. Note: Well-healed oblique scar RIGHT lower quadrant consistent with prior appendectomy. No umbilical hernia. No diastases. No abdominal pain. No guarding.   Male Genitourinary Sexual Maturity Tanner 5 - Adult hair pattern and Adult penile size and shape. Note: Circumcised male. Testes epididymides and cords normal.  Obvious RIGHT groin bulge reducible consistent with RIGHT inguinal hernia. Impulsed though at LEFT external inguinal ring consistent with small LEFT inguinal hernia.   Peripheral Vascular Upper Extremity Inspection - Left - No Cyanotic nailbeds, Not Ischemic. Right - No Cyanotic nailbeds, Not Ischemic.  Neurologic Neurologic evaluation reveals -normal attention span and ability to concentrate, able to name objects and repeat phrases. Appropriate fund of knowledge , normal sensation and normal  coordination. Mental Status Affect - not angry, not paranoid. Cranial Nerves-Normal Bilaterally. Gait-Normal.  Neuropsychiatric Mental status exam performed with findings of-able to articulate well with normal speech/language, rate, volume and coherence, thought content normal with ability to perform basic computations and apply abstract reasoning and no evidence of hallucinations, delusions, obsessions or homicidal/suicidal ideation.  Musculoskeletal Global Assessment Spine, Ribs and Pelvis - no instability, subluxation or laxity. Right Upper Extremity - no instability, subluxation or laxity.  Lymphatic Head & Neck  General Head & Neck Lymphatics: Bilateral - Description - No Localized lymphadenopathy. Axillary  General Axillary Region: Bilateral - Description - No Localized lymphadenopathy. Femoral & Inguinal  Generalized Femoral & Inguinal Lymphatics: Left - Description - No Localized lymphadenopathy. Right - Description - No Localized lymphadenopathy.    Assessment & Plan Richard Hector MD; 03/15/2014 12:03 PM)  BILATERAL INGUINAL HERNIA WITHOUT OBSTRUCTION OR GANGRENE, RECURRENCE NOT SPECIFIED (550.92  K40.20) Impression: Definite RIGHT and probable LEFT inguinal hernias. I think he would benefit from surgical exploration and repair with mesh. Long discussion with him. He agrees and wishes to be aggressive. Because he is an avid golfer and it is a quiet time, he like to try and get it done sooner versus later. Interested in laparoscopic approach. Literature given to him.  Current Plans Schedule for Surgery Discussed regular exercise with patient. The anatomy & physiology of the abdominal wall and pelvic floor was discussed. The pathophysiology of hernias in the inguinal and pelvic region was discussed. Natural history risks such as progressive enlargement, pain, incarceration, and strangulation was discussed. Contributors to complications such as smoking, obesity,  diabetes, prior surgery, etc were discussed.  I feel the risks of no intervention will lead to serious problems that outweigh the operative risks; therefore, I recommended surgery to reduce and repair the hernia. I explained laparoscopic techniques with possible need for an open approach. I noted usual use of mesh to patch and/or buttress hernia repair  Risks such as bleeding, infection, abscess, need for further treatment, heart attack, death, and other risks were discussed. I noted a good likelihood this will help address the problem. Goals of post-operative recovery were discussed as well. Possibility that this will not correct all symptoms was explained. I stressed the importance of low-impact activity, aggressive pain control, avoiding constipation, & not pushing through pain to minimize risk of post-operative chronic pain or injury. Possibility of reherniation was discussed. We will work to minimize complications.  An educational handout further explaining the pathology & treatment options was given as well. Questions were answered. The patient expresses understanding & wishes to proceed with surgery. Pt Education - [facility] Hernia Post-Op HCI (Janett Kamath): discussed with patient and provided information. Pt Education - [facility] Good Bowel Health (Adam Sanjuan) Pt Education - [facility] Pain Control (Jeniffer Culliver)  Richard Ware, M.D., F.A.C.S. Gastrointestinal and Minimally Invasive Surgery [facility] [address], [place], [place] [address] [phone] Main / Paging

## 2014-03-22 ENCOUNTER — Ambulatory Visit: Payer: BLUE CROSS/BLUE SHIELD | Attending: Specialist | Admitting: Physical Therapy

## 2014-03-22 ENCOUNTER — Encounter: Payer: Self-pay | Admitting: Physical Therapy

## 2014-03-22 DIAGNOSIS — M4000 Postural kyphosis, site unspecified: Secondary | ICD-10-CM | POA: Insufficient documentation

## 2014-03-22 DIAGNOSIS — M25512 Pain in left shoulder: Secondary | ICD-10-CM

## 2014-03-22 NOTE — Therapy (Signed)
[place] [address] [place], [place], [address] Phone: [phone]   Fax:  [phone]  Physical Therapy Evaluation  Patient Details  Name: Antoney Biven MRN: [id number] Date of Birth: 16-Jun-1960 Referring Provider:  Sydnee Cabal, MD  Encounter Date: 03/22/2014      PT End of Session - 03/22/14 1126    Visit Number 1   Date for PT Re-Evaluation 05/22/14   PT Start Time 1056   PT Stop Time 1135   PT Time Calculation (min) 39 min   Activity Tolerance Patient tolerated treatment well      Past Medical History  Diagnosis Date  . Hypertension   . Psoriasis     fingers    Past Surgical History  Procedure Laterality Date  . Menisus tear repair  Sept. 2007    (R) knee  . Broken shoulder blade bone  June 1990    (L)  . 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 Appendectomy  1980    There were no vitals taken for this visit.  Visit Diagnosis:  Left shoulder pain - Plan: PT plan of care cert/re-cert  Postural kyphosis, unspecified spinal region - Plan: PT plan of care cert/re-cert      Subjective Assessment - 03/22/14 1057    Symptoms Reports left shoulder pain for about two years.  Plays golf and does yoga, he does not know of a specific injury. MD diagnosis in impingement. Started noticing some periodic pain with golf and yoga   Pertinent History had a cortisone injection last week.   Limitations Lifting;House hold activities   Diagnostic tests x-rays negative   Patient Stated Goals no pain and normal golf   Currently in Pain? Yes   Pain Score 1    Pain Location Shoulder   Pain Orientation Left   Pain Descriptors / Indicators Sore   Pain Type Chronic pain   Pain Radiating Towards denies   Pain Onset More than a month ago   Pain Frequency Intermittent   Aggravating Factors  golf, reaching, especially behind the back    Pain Relieving Factors rest   Effect of Pain on Daily Activities  moslty limited with putting on belt and playing golf          [facility] PT Assessment - 03/22/14 0001    Assessment   Medical Diagnosis left RC impingement   Onset Date 03/21/12   Prior Therapy none   Precautions   Precautions None   Balance Screen   Has the patient fallen in the past 6 months No   Has the patient had a decrease in activity level because of a fear of falling?  No   Is the patient reluctant to leave their home because of a fear of falling?  No   Prior Function   Vocation Requirements sitting at desk   Leisure golf and yoga   Posture/Postural Control   Posture Comments extremely protracted scapulae   AROM   Left Shoulder Flexion 150 Degrees   Left Shoulder ABduction 150 Degrees   Left Shoulder Internal Rotation 60 Degrees   Left Shoulder External Rotation 60 Degrees   Strength   Overall Strength Comments ER was 4-/5   Palpation   Palpation some tightness and tenderness int he upper trap and rhomboid   Special Tests    Special Tests --  negative empty can, positive impingement  PT Education - 03/22/14 1125    Education provided Yes   Education Details Scapular stabilization with blue Tband   Person(s) Educated Patient   Methods Explanation;Demonstration;Handout   Comprehension Verbalized understanding          PT Short Term Goals - 03/22/14 1127    PT SHORT TERM GOAL #1   Title independent with initial HEP   Time 1   Period Weeks   Status New           PT Long Term Goals - 03/22/14 1128    PT LONG TERM GOAL #1   Title understand and demo proper posture while sitting   Time 8   Period Weeks   Status New   PT LONG TERM GOAL #2   Title decrease pain 50%    Time 8   Period Weeks   Status New   PT LONG TERM GOAL #3   Title increase ROM of ER and IR to WNL's   Time 8   Period Weeks   Status New   PT LONG TERM GOAL #4   Title increase strength of ER to 4+/5   Time 8   Period Weeks   Status New                Plan - 03/22/14 1126    Clinical Impression Statement Patient reports left shoulder pain x 2 years, he is very active, has some limitation in ROM but the biggest issue is very protracted scapulae, he is right handed   Pt will benefit from skilled therapeutic intervention in order to improve on the following deficits Decreased range of motion;Improper body mechanics;Postural dysfunction;Pain;Impaired UE functional use;Decreased strength   Rehab Potential Good   PT Frequency 1x / week   PT Duration 8 weeks   PT Treatment/Interventions Electrical Stimulation;Ultrasound;Moist Heat;Therapeutic activities;Therapeutic exercise;Neuromuscular re-education;Manual techniques;Patient/family education;Passive range of motion   PT Next Visit Plan add gym exercises   Consulted and Agree with Plan of Care Patient         Problem List Patient Active Problem List   Diagnosis Date Noted  . Right groin pain 03/03/2014  . Pain of right upper arm 03/03/2014  . Infected nailbed of toe 07/14/2013  . Ingrown toenail 07/14/2013  . Routine general medical examination at a health care facility 04/17/2012  . HTN (hypertension) 01/27/2012  . Psoriasis 01/27/2012    Sumner Boast, PT 03/22/2014, 11:37 AM  [address] [place], [place], [address] Phone: [phone]   Fax:  [phone]

## 2014-03-30 ENCOUNTER — Encounter: Payer: Self-pay | Admitting: Physical Therapy

## 2014-03-30 ENCOUNTER — Ambulatory Visit: Payer: BLUE CROSS/BLUE SHIELD | Admitting: Physical Therapy

## 2014-03-30 ENCOUNTER — Encounter: Payer: Self-pay | Admitting: Family Medicine

## 2014-03-30 DIAGNOSIS — M4000 Postural kyphosis, site unspecified: Secondary | ICD-10-CM

## 2014-03-30 DIAGNOSIS — M25512 Pain in left shoulder: Secondary | ICD-10-CM

## 2014-03-30 NOTE — Therapy (Signed)
[place], [place], [address] Phone: [phone]   Fax:  [phone]  Physical Therapy Treatment  Patient Details  Name: Richard Ware MRN: [id number] Date of Birth: 06-Oct-1960 Referring Provider:  Sydnee Cabal, MD  Encounter Date: 03/30/2014      PT End of Session - 03/30/14 1002    Visit Number 2   PT Start Time 6433   PT Stop Time 2951   PT Time Calculation (min) 36 min      Past Medical History  Diagnosis Date  . Hypertension   . Psoriasis     fingers    Past Surgical History  Procedure Laterality Date  . Menisus tear repair  Sept. 2007    (R) knee  . Broken shoulder blade bone  June 1990    (L)  . 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 Appendectomy  1980    There were no vitals filed for this visit.  Visit Diagnosis:  Left shoulder pain  Postural kyphosis, unspecified spinal region      Subjective Assessment - 03/30/14 0930    Symptoms I am really pleaesd with the exercises, I think they are helping and I should have addressed this years ago   Limitations Lifting;House hold activities   Patient Stated Goals no pain and normal golf   Currently in Pain? Yes   Pain Score 1    Pain Location Shoulder   Pain Orientation Left   Pain Descriptors / Indicators Sore   Pain Type Chronic pain   Pain Onset More than a month ago   Pain Frequency Intermittent   Aggravating Factors  reaching behind the back   Pain Relieving Factors rest                       [facility] Adult PT Treatment/Exercise - 03/30/14 0001    Shoulder Exercises: ROM/Strengthening   UBE (Upper Arm Bike) Constant work 40 watts 4 minutes   Other ROM/Strengthening Exercises high low pulley 5# 3 different ways for scap stabilization   Other ROM/Strengthening Exercises 4# houghston's   Shoulder Exercises: Power Warden/ranger Exercises seated row 35#   Other Power Tower  Exercises lats 35#, 35# serratus push                PT Education - 03/30/14 1001    Education provided Yes   Education Details went over all safe gym activities and how to focus on scapular area safely, went over weights, form and what not to do   Person(s) Educated Patient   Methods Explanation;Demonstration;Verbal cues   Comprehension Verbalized understanding;Returned demonstration          PT Short Term Goals - 03/30/14 1003    PT SHORT TERM GOAL #1   Title independent with initial HEP   Status Achieved           PT Long Term Goals - 03/30/14 1003    PT LONG TERM GOAL #1   Title understand and demo proper posture while sitting   Status Achieved   PT LONG TERM GOAL #2   Title decrease pain 50%    Status Achieved   PT LONG TERM GOAL #4   Title increase strength of ER to 4+/5   Status Achieved               Plan - 03/30/14 1002  Clinical Impression Statement Doing great after doing HEP for a week.  Played golf without pain   Pt will benefit from skilled therapeutic intervention in order to improve on the following deficits Decreased range of motion;Improper body mechanics;Postural dysfunction;Pain;Impaired UE functional use;Decreased strength   Rehab Potential Good   PT Frequency 1x / week   PT Duration 4 weeks   PT Treatment/Interventions Electrical Stimulation;Ultrasound;Moist Heat;Therapeutic activities;Therapeutic exercise;Neuromuscular re-education;Manual techniques;Patient/family education;Passive range of motion   PT Next Visit Plan Will hold PT over the next few weeks as he is independent and safe with HEP and with no pain   Consulted and Agree with Plan of Care Patient        Problem List Patient Active Problem List   Diagnosis Date Noted  . Right groin pain 03/03/2014  . Pain of right upper arm 03/03/2014  . Infected nailbed of toe 07/14/2013  . Ingrown toenail 07/14/2013  . Routine general medical examination at a health care  facility 04/17/2012  . HTN (hypertension) 01/27/2012  . Psoriasis 01/27/2012    Sumner Boast, PT 03/30/2014, 10:04 AM  [address] [place], [place], [address] Phone: [phone]   Fax:  [phone]

## 2014-04-06 ENCOUNTER — Telehealth: Payer: Self-pay | Admitting: Family Medicine

## 2014-04-06 NOTE — Telephone Encounter (Signed)
pre visit letter sent °

## 2014-04-21 ENCOUNTER — Encounter: Payer: Self-pay | Admitting: Family Medicine

## 2014-04-25 ENCOUNTER — Encounter: Payer: Self-pay | Admitting: Family Medicine

## 2014-04-25 ENCOUNTER — Ambulatory Visit (INDEPENDENT_AMBULATORY_CARE_PROVIDER_SITE_OTHER): Payer: BLUE CROSS/BLUE SHIELD | Admitting: Family Medicine

## 2014-04-25 VITALS — BP 118/70 | HR 67 | Temp 99.5°F | Resp 16 | Ht 74.75 in | Wt 205.5 lb

## 2014-04-25 DIAGNOSIS — Z0000 Encounter for general adult medical examination without abnormal findings: Secondary | ICD-10-CM | POA: Diagnosis not present

## 2014-04-25 LAB — BASIC METABOLIC PANEL WITH GFR
BUN: 13 mg/dL (ref 6–23)
CO2: 28 meq/L (ref 19–32)
Calcium: 9.7 mg/dL (ref 8.4–10.5)
Chloride: 104 meq/L (ref 96–112)
Creatinine, Ser: 0.79 mg/dL (ref 0.40–1.50)
GFR: 108.62 mL/min
Glucose, Bld: 91 mg/dL (ref 70–99)
Potassium: 3.9 meq/L (ref 3.5–5.1)
Sodium: 138 meq/L (ref 135–145)

## 2014-04-25 LAB — CBC WITH DIFFERENTIAL/PLATELET
BASOS ABS: 0 10*3/uL (ref 0.0–0.1)
Basophils Relative: 0.6 % (ref 0.0–3.0)
EOS ABS: 0.1 10*3/uL (ref 0.0–0.7)
Eosinophils Relative: 1.6 % (ref 0.0–5.0)
HCT: 40.7 % (ref 39.0–52.0)
Hemoglobin: 14.1 g/dL (ref 13.0–17.0)
LYMPHS PCT: 31.1 % (ref 12.0–46.0)
Lymphs Abs: 1.6 10*3/uL (ref 0.7–4.0)
MCHC: 34.6 g/dL (ref 30.0–36.0)
MCV: 91.7 fl (ref 78.0–100.0)
MONO ABS: 0.5 10*3/uL (ref 0.1–1.0)
Monocytes Relative: 9.8 % (ref 3.0–12.0)
NEUTROS PCT: 56.9 % (ref 43.0–77.0)
Neutro Abs: 2.9 10*3/uL (ref 1.4–7.7)
PLATELETS: 255 10*3/uL (ref 150.0–400.0)
RBC: 4.44 Mil/uL (ref 4.22–5.81)
RDW: 13 % (ref 11.5–15.5)
WBC: 5 10*3/uL (ref 4.0–10.5)

## 2014-04-25 LAB — LIPID PANEL
Cholesterol: 159 mg/dL (ref 0–200)
HDL: 50.5 mg/dL
LDL Cholesterol: 93 mg/dL (ref 0–99)
NonHDL: 108.5
Total CHOL/HDL Ratio: 3
Triglycerides: 77 mg/dL (ref 0.0–149.0)
VLDL: 15.4 mg/dL (ref 0.0–40.0)

## 2014-04-25 LAB — HEPATIC FUNCTION PANEL
ALK PHOS: 72 U/L (ref 39–117)
ALT: 21 U/L (ref 0–53)
AST: 17 U/L (ref 0–37)
Albumin: 4.4 g/dL (ref 3.5–5.2)
Bilirubin, Direct: 0.2 mg/dL (ref 0.0–0.3)
Total Bilirubin: 0.9 mg/dL (ref 0.2–1.2)
Total Protein: 7.1 g/dL (ref 6.0–8.3)

## 2014-04-25 LAB — PSA: PSA: 1.06 ng/mL (ref 0.10–4.00)

## 2014-04-25 LAB — TSH: TSH: 1.22 u[IU]/mL (ref 0.35–4.50)

## 2014-04-25 MED ORDER — AMLODIPINE BESYLATE 10 MG PO TABS: ORAL_TABLET | ORAL | Status: DC

## 2014-04-25 MED ORDER — LOSARTAN POTASSIUM-HCTZ 100-12.5 MG PO TABS: ORAL_TABLET | ORAL | Status: DC

## 2014-04-25 NOTE — Patient Instructions (Signed)
Follow up in 6 months to recheck BP and cholesterol We'll notify you of your lab results and make any changes if needed Keep up the good work!  You look great! Call with any questions or concerns Hang in there!!  We'll keep hoping and praying for you both!

## 2014-04-25 NOTE — Assessment & Plan Note (Signed)
Pt's PE WNL w/ exception of known inguinal hernias.  UTD on colonoscopy.  Check labs.  Anticipatory guidance provided.

## 2014-04-25 NOTE — Progress Notes (Signed)
   Subjective:    Patient ID: Richard Ware, male    DOB: 07/02/1960, 54 y.o.   MRN: [id number]  HPI CPE- UTD on colonoscopy.  No concerns.   Review of Systems Patient reports no vision/hearing changes, anorexia, fever ,adenopathy, persistant/recurrent hoarseness, swallowing issues, chest pain, palpitations, edema, persistant/recurrent cough, hemoptysis, dyspnea (rest,exertional, paroxysmal nocturnal), gastrointestinal  bleeding (melena, rectal bleeding), abdominal pain, excessive heart burn, GU symptoms (dysuria, hematuria, voiding/incontinence issues) syncope, focal weakness, memory loss, numbness & tingling, skin/hair/nail changes, depression, anxiety, abnormal bruising/bleeding, musculoskeletal symptoms/signs.     Objective:   Physical Exam BP 118/70 mmHg  Pulse 67  Temp(Src) 99.5 F (37.5 C) (Oral)  Resp 16  Ht 6' 2.75" (1.899 m)  Wt 205 lb 8 oz (93.214 kg)  BMI 25.85 kg/m2  SpO2 97%  General Appearance:    Alert, cooperative, no distress, appears stated age  Head:    Normocephalic, without obvious abnormality, atraumatic  Eyes:    PERRL, conjunctiva/corneas clear, EOM's intact, fundi    benign, both eyes       Ears:    Normal TM's and external ear canals, both ears  Nose:   Nares normal, septum midline, mucosa normal, no drainage   or sinus tenderness  Throat:   Lips, mucosa, and tongue normal; teeth and gums normal  Neck:   Supple, symmetrical, trachea midline, no adenopathy;       thyroid:  No enlargement/tenderness/nodules  Back:     Symmetric, no curvature, ROM normal, no CVA tenderness  Lungs:     Clear to auscultation bilaterally, respirations unlabored  Chest wall:    No tenderness or deformity  Heart:    Regular rate and rhythm, S1 and S2 normal, no murmur, rub   or gallop  Abdomen:     Soft, non-tender, bowel sounds active all four quadrants,    no masses, no organomegaly  Genitalia:    Normal male without lesion, discharge or tenderness  Rectal:    Normal  tone, normal prostate, no masses or tenderness  Extremities:   Extremities normal, atraumatic, no cyanosis or edema  Pulses:   2+ and symmetric all extremities  Skin:   Skin color, texture, turgor normal, no rashes or lesions  Lymph nodes:   Cervical, supraclavicular, and axillary nodes normal  Neurologic:   CNII-XII intact. Normal strength, sensation and reflexes      throughout          Assessment & Plan:

## 2014-04-25 NOTE — Progress Notes (Signed)
Pre visit review using our clinic review tool, if applicable. No additional management support is needed unless otherwise documented below in the visit note. 

## 2014-04-26 ENCOUNTER — Encounter: Payer: Self-pay | Admitting: Family Medicine

## 2014-05-19 ENCOUNTER — Encounter: Payer: Self-pay | Admitting: Family Medicine

## 2014-05-20 ENCOUNTER — Other Ambulatory Visit: Payer: Self-pay | Admitting: General Practice

## 2014-05-20 MED ORDER — LOSARTAN POTASSIUM 100 MG PO TABS: 100.0000 mg | ORAL_TABLET | Freq: Every day | ORAL | Status: DC

## 2014-05-20 MED ORDER — LOSARTAN POTASSIUM-HCTZ 100-12.5 MG PO TABS: ORAL_TABLET | ORAL | Status: DC

## 2014-05-20 NOTE — Telephone Encounter (Signed)
Med filled to both local and mail order. Chart updated to reflect changes

## 2014-05-24 ENCOUNTER — Encounter (HOSPITAL_COMMUNITY)
Admission: RE | Admit: 2014-05-24 | Discharge: 2014-05-24 | Disposition: A | Discharge status: Home / Self Care | Payer: BLUE CROSS/BLUE SHIELD | Source: Ambulatory Visit | Attending: Surgery | Admitting: Surgery

## 2014-05-24 ENCOUNTER — Encounter (HOSPITAL_COMMUNITY): Payer: Self-pay

## 2014-05-24 DIAGNOSIS — Z01810 Encounter for preprocedural cardiovascular examination: Secondary | ICD-10-CM | POA: Diagnosis not present

## 2014-05-24 DIAGNOSIS — I10 Essential (primary) hypertension: Secondary | ICD-10-CM | POA: Insufficient documentation

## 2014-05-24 DIAGNOSIS — Z01812 Encounter for preprocedural laboratory examination: Secondary | ICD-10-CM | POA: Insufficient documentation

## 2014-05-24 HISTORY — DX: Periapical abscess without sinus: K04.7

## 2014-05-24 LAB — CBC
HCT: 39.4 % (ref 39.0–52.0)
Hemoglobin: 13.3 g/dL (ref 13.0–17.0)
MCH: 31.1 pg (ref 26.0–34.0)
MCHC: 33.8 g/dL (ref 30.0–36.0)
MCV: 92.1 fL (ref 78.0–100.0)
PLATELETS: 223 10*3/uL (ref 150–400)
RBC: 4.28 MIL/uL (ref 4.22–5.81)
RDW: 12.2 % (ref 11.5–15.5)
WBC: 7.4 10*3/uL (ref 4.0–10.5)

## 2014-05-24 LAB — BASIC METABOLIC PANEL
Anion gap: 4 — ABNORMAL LOW (ref 5–15)
BUN: 10 mg/dL (ref 6–20)
CHLORIDE: 104 mmol/L (ref 101–111)
CO2: 27 mmol/L (ref 22–32)
Calcium: 9.2 mg/dL (ref 8.9–10.3)
Creatinine, Ser: 0.78 mg/dL (ref 0.61–1.24)
GFR calc non Af Amer: >60 mL/min (ref >60)
GLUCOSE: 100 mg/dL — AB (ref 70–99)
Potassium: 3.8 mmol/L (ref 3.5–5.1)
Sodium: 135 mmol/L (ref 135–145)

## 2014-05-24 NOTE — Patient Instructions (Signed)
Richard Ware  05/24/2014   Your procedure is scheduled on:    Monday May 30, 2014   Report to [facility] Main Entrance and follow signs to  [place] arrive at 5:00 AM.  Call this number if you have problems the morning of surgery [phone] or Presurgical Testing [phone].   Remember:  Do not eat food or drink liquids :After Midnight.  .     Take these medicines the morning of surgery with A SIP OF WATER: Amlodipine (Norvasc)                               You may not have any metal on your body including hair pins and piercings  Do not wear jewelry, colognes, lotions, powders, or deodorant.  Men may shave face and neck.               Do not bring valuables to the hospital. [place].  Contacts, dentures or bridgework may not be worn into surgery.    Patients discharged the day of surgery will not be allowed to drive home.  Name and phone number of your driver:Richard Ware (wife)   ________________________________________________________________________  [facility] - Preparing for Surgery Before surgery, you can play an important role.  Because skin is not sterile, your skin needs to be as free of germs as possible.  You can reduce the number of germs on your skin by washing with CHG (chlorahexidine gluconate) soap before surgery.  CHG is an antiseptic cleaner which kills germs and bonds with the skin to continue killing germs even after washing. Please DO NOT use if you have an allergy to CHG or antibacterial soaps.  If your skin becomes reddened/irritated stop using the CHG and inform your nurse when you arrive at Short Stay. Do not shave (including legs and underarms) for at least 48 hours prior to the first CHG shower.  You may shave your face/neck. Please follow these instructions carefully:  1.  Shower with CHG Soap the night before surgery and the  morning of Surgery.  2.  If you choose to wash your  hair, wash your hair first as usual with your  normal  shampoo.  3.  After you shampoo, rinse your hair and body thoroughly to remove the  shampoo.                           4.  Use CHG as you would any other liquid soap.  You can apply chg directly  to the skin and wash                       Gently with a scrungie or clean washcloth.  5.  Apply the CHG Soap to your body ONLY FROM THE NECK DOWN.   Do not use on face/ open                           Wound or open sores. Avoid contact with eyes, ears mouth and genitals (private parts).                       Wash face,  Genitals (private parts) with your normal soap.             6.  Wash thoroughly, paying special attention to the area where your surgery  will be performed.  7.  Thoroughly rinse your body with warm water from the neck down.  8.  DO NOT shower/wash with your normal soap after using and rinsing off  the CHG Soap.                9.  Pat yourself dry with a clean towel.            10.  Wear clean pajamas.            11.  Place clean sheets on your bed the night of your first shower and do not  sleep with pets. Day of Surgery : Do not apply any lotions/deodorants the morning of surgery.  Please wear clean clothes to the hospital/surgery center.  FAILURE TO FOLLOW THESE INSTRUCTIONS MAY RESULT IN THE CANCELLATION OF YOUR SURGERY PATIENT SIGNATURE_________________________________  NURSE SIGNATURE__________________________________  ________________________________________________________________________

## 2014-05-24 NOTE — Progress Notes (Signed)
Pt currently complaining of left lower tooth ache. Pt is trying to get into see dentist today.

## 2014-05-25 ENCOUNTER — Encounter (HOSPITAL_COMMUNITY): Payer: Self-pay

## 2014-05-25 NOTE — Progress Notes (Signed)
Pt called this am stated he saw a dentist on 05/23/2014 in regards to tooth ache. States was informed he has an infection of left lower wisdom tooth an was placed on amoxicillin. Med placed in med profile and pt instructed was okay to take morning of surgery if has not completed. Pt verbalized understanding.

## 2014-05-29 NOTE — Anesthesia Preprocedure Evaluation (Addendum)
Anesthesia Evaluation  Patient identified by MRN, date of birth, ID band Patient awake    Reviewed: Allergy & Precautions, H&P , NPO status , Patient's Chart, lab work & pertinent test results  Airway Mallampati: II  TM Distance: >3 FB Neck ROM: full    Dental no notable dental hx. (+) Teeth Intact, Dental Advisory Given   Pulmonary neg pulmonary ROS,  breath sounds clear to auscultation  Pulmonary exam normal       Cardiovascular Exercise Tolerance: Good hypertension, Pt. on medications negative cardio ROS Normal cardiovascular examRhythm:regular Rate:Normal     Neuro/Psych negative neurological ROS  negative psych ROS   GI/Hepatic negative GI ROS, Neg liver ROS,   Endo/Other  negative endocrine ROS  Renal/GU negative Renal ROS  negative genitourinary   Musculoskeletal   Abdominal   Peds  Hematology negative hematology ROS (+)   Anesthesia Other Findings   Reproductive/Obstetrics negative OB ROS                            Anesthesia Physical Anesthesia Plan  ASA: II  Anesthesia Plan: General   Post-op Pain Management:    Induction: Intravenous  Airway Management Planned: Oral ETT  Additional Equipment:   Intra-op Plan:   Post-operative Plan: Extubation in OR  Informed Consent: I have reviewed the patients History and Physical, chart, labs and discussed the procedure including the risks, benefits and alternatives for the proposed anesthesia with the patient or authorized representative who has indicated his/her understanding and acceptance.   Dental Advisory Given  Plan Discussed with: CRNA and Surgeon  Anesthesia Plan Comments:         Anesthesia Quick Evaluation

## 2014-05-30 ENCOUNTER — Encounter (HOSPITAL_COMMUNITY): Payer: Self-pay | Admitting: *Deleted

## 2014-05-30 ENCOUNTER — Ambulatory Visit (HOSPITAL_COMMUNITY): Payer: BLUE CROSS/BLUE SHIELD | Admitting: Anesthesiology

## 2014-05-30 ENCOUNTER — Ambulatory Visit (HOSPITAL_COMMUNITY)
Admission: RE | Admit: 2014-05-30 | Discharge: 2014-05-30 | Disposition: A | Discharge status: Home / Self Care | Payer: BLUE CROSS/BLUE SHIELD | Source: Ambulatory Visit | Attending: Surgery | Admitting: Surgery

## 2014-05-30 ENCOUNTER — Encounter (HOSPITAL_COMMUNITY)
Admission: RE | Disposition: A | Discharge status: Home / Self Care | Payer: Self-pay | Source: Ambulatory Visit | Attending: Surgery

## 2014-05-30 DIAGNOSIS — Z9049 Acquired absence of other specified parts of digestive tract: Secondary | ICD-10-CM | POA: Diagnosis not present

## 2014-05-30 DIAGNOSIS — I10 Essential (primary) hypertension: Secondary | ICD-10-CM | POA: Diagnosis not present

## 2014-05-30 DIAGNOSIS — K4020 Bilateral inguinal hernia, without obstruction or gangrene, not specified as recurrent: Secondary | ICD-10-CM | POA: Insufficient documentation

## 2014-05-30 DIAGNOSIS — D176 Benign lipomatous neoplasm of spermatic cord: Secondary | ICD-10-CM | POA: Diagnosis not present

## 2014-05-30 DIAGNOSIS — K4190 Unilateral femoral hernia, without obstruction or gangrene, not specified as recurrent: Secondary | ICD-10-CM | POA: Insufficient documentation

## 2014-05-30 HISTORY — PX: INGUINAL HERNIA REPAIR: SHX194

## 2014-05-30 HISTORY — PX: INSERTION OF MESH: SHX5868

## 2014-05-30 SURGERY — REPAIR, HERNIA, INGUINAL, LAPAROSCOPIC
Anesthesia: General | Laterality: Bilateral

## 2014-05-30 MED ORDER — GLYCOPYRROLATE 0.2 MG/ML IJ SOLN
INTRAMUSCULAR | Status: AC
  Filled 2014-05-30: qty 2

## 2014-05-30 MED ORDER — ONDANSETRON HCL 4 MG/2ML IJ SOLN
INTRAMUSCULAR | Status: DC | PRN
  Administered 2014-05-30: 4 mg via INTRAVENOUS

## 2014-05-30 MED ORDER — LIDOCAINE HCL (CARDIAC) 20 MG/ML IV SOLN
INTRAVENOUS | Status: AC
  Filled 2014-05-30: qty 5

## 2014-05-30 MED ORDER — LIDOCAINE HCL (CARDIAC) 20 MG/ML IV SOLN
INTRAVENOUS | Status: DC | PRN
  Administered 2014-05-30: 100 mg via INTRAVENOUS

## 2014-05-30 MED ORDER — PROPOFOL 10 MG/ML IV BOLUS
INTRAVENOUS | Status: AC
  Filled 2014-05-30: qty 20

## 2014-05-30 MED ORDER — MIDAZOLAM HCL 5 MG/5ML IJ SOLN
INTRAMUSCULAR | Status: DC | PRN
  Administered 2014-05-30: 2 mg via INTRAVENOUS

## 2014-05-30 MED ORDER — KETOROLAC TROMETHAMINE 30 MG/ML IJ SOLN
INTRAMUSCULAR | Status: AC
Start: 2014-05-30 — End: 2014-05-30
  Filled 2014-05-30: qty 1

## 2014-05-30 MED ORDER — BUPIVACAINE-EPINEPHRINE 0.25% -1:200000 IJ SOLN
INTRAMUSCULAR | Status: AC
  Filled 2014-05-30: qty 1

## 2014-05-30 MED ORDER — FENTANYL CITRATE (PF) 100 MCG/2ML IJ SOLN
INTRAMUSCULAR | Status: DC | PRN
  Administered 2014-05-30 (×5): 50 ug via INTRAVENOUS

## 2014-05-30 MED ORDER — CEFAZOLIN SODIUM-DEXTROSE 2-3 GM-% IV SOLR
2.0000 g | INTRAVENOUS | Status: AC
  Administered 2014-05-30: 2 g via INTRAVENOUS

## 2014-05-30 MED ORDER — GLYCOPYRROLATE 0.2 MG/ML IJ SOLN
INTRAMUSCULAR | Status: DC | PRN
  Administered 2014-05-30: 0.2 mg via INTRAVENOUS
  Administered 2014-05-30: 0.6 mg via INTRAVENOUS

## 2014-05-30 MED ORDER — ROCURONIUM BROMIDE 100 MG/10ML IV SOLN
INTRAVENOUS | Status: DC | PRN
  Administered 2014-05-30: 40 mg via INTRAVENOUS
  Administered 2014-05-30 (×2): 10 mg via INTRAVENOUS

## 2014-05-30 MED ORDER — GLYCOPYRROLATE 0.2 MG/ML IJ SOLN
INTRAMUSCULAR | Status: AC
  Filled 2014-05-30: qty 1

## 2014-05-30 MED ORDER — MIDAZOLAM HCL 2 MG/2ML IJ SOLN
INTRAMUSCULAR | Status: AC
  Filled 2014-05-30: qty 2

## 2014-05-30 MED ORDER — LACTATED RINGERS IV SOLN
INTRAVENOUS | Status: DC | PRN
  Administered 2014-05-30: 07:00:00 via INTRAVENOUS

## 2014-05-30 MED ORDER — CEFAZOLIN SODIUM-DEXTROSE 2-3 GM-% IV SOLR
INTRAVENOUS | Status: AC
  Filled 2014-05-30: qty 50

## 2014-05-30 MED ORDER — ONDANSETRON HCL 4 MG/2ML IJ SOLN
INTRAMUSCULAR | Status: AC
  Filled 2014-05-30: qty 2

## 2014-05-30 MED ORDER — PROPOFOL 10 MG/ML IV BOLUS
INTRAVENOUS | Status: DC | PRN
  Administered 2014-05-30: 200 mg via INTRAVENOUS

## 2014-05-30 MED ORDER — CHLORHEXIDINE GLUCONATE 4 % EX LIQD: 1.0000 "application " | Freq: Once | CUTANEOUS | Status: DC

## 2014-05-30 MED ORDER — OXYCODONE HCL 5 MG PO TABS: 5.0000 mg | ORAL_TABLET | ORAL | Status: DC | PRN

## 2014-05-30 MED ORDER — BUPIVACAINE-EPINEPHRINE 0.25% -1:200000 IJ SOLN
INTRAMUSCULAR | Status: DC | PRN
  Administered 2014-05-30: 100 mL

## 2014-05-30 MED ORDER — DEXAMETHASONE SODIUM PHOSPHATE 10 MG/ML IJ SOLN
INTRAMUSCULAR | Status: AC
  Filled 2014-05-30: qty 1

## 2014-05-30 MED ORDER — NEOSTIGMINE METHYLSULFATE 10 MG/10ML IV SOLN
INTRAVENOUS | Status: AC
  Filled 2014-05-30: qty 1

## 2014-05-30 MED ORDER — NEOSTIGMINE METHYLSULFATE 10 MG/10ML IV SOLN
INTRAVENOUS | Status: DC | PRN
  Administered 2014-05-30: 5 mg via INTRAVENOUS

## 2014-05-30 MED ORDER — FENTANYL CITRATE (PF) 250 MCG/5ML IJ SOLN
INTRAMUSCULAR | Status: AC
  Filled 2014-05-30: qty 5

## 2014-05-30 MED ORDER — SUCCINYLCHOLINE CHLORIDE 20 MG/ML IJ SOLN
INTRAMUSCULAR | Status: DC | PRN
  Administered 2014-05-30: 100 mg via INTRAVENOUS

## 2014-05-30 MED ORDER — LACTATED RINGERS IV SOLN
INTRAVENOUS | Status: DC
  Administered 2014-05-30: 1000 mL via INTRAVENOUS

## 2014-05-30 MED ORDER — DEXAMETHASONE SODIUM PHOSPHATE 10 MG/ML IJ SOLN
INTRAMUSCULAR | Status: DC | PRN
  Administered 2014-05-30: 10 mg via INTRAVENOUS

## 2014-05-30 MED ORDER — KETOROLAC TROMETHAMINE 30 MG/ML IJ SOLN
INTRAMUSCULAR | Status: DC | PRN
  Administered 2014-05-30: 30 mg via INTRAVENOUS

## 2014-05-30 MED ORDER — HYDROMORPHONE HCL 1 MG/ML IJ SOLN
0.2500 mg | INTRAMUSCULAR | Status: DC | PRN
  Administered 2014-05-30 (×2): 0.5 mg via INTRAVENOUS

## 2014-05-30 MED ORDER — HYDROMORPHONE HCL 1 MG/ML IJ SOLN
INTRAMUSCULAR | Status: AC
  Filled 2014-05-30: qty 1

## 2014-05-30 MED ORDER — OXYCODONE HCL 5 MG PO TABS
10.0000 mg | ORAL_TABLET | ORAL | Status: DC | PRN
  Administered 2014-05-30: 10 mg via ORAL
  Filled 2014-05-30: qty 2

## 2014-05-30 MED ORDER — ROCURONIUM BROMIDE 100 MG/10ML IV SOLN
INTRAVENOUS | Status: AC
  Filled 2014-05-30: qty 1

## 2014-05-30 SURGICAL SUPPLY — 27 items
CABLE HIGH FREQUENCY MONO STRZ (ELECTRODE) ×2 IMPLANT
CHLORAPREP W/TINT 26ML (MISCELLANEOUS) ×2 IMPLANT
DECANTER SPIKE VIAL GLASS SM (MISCELLANEOUS) ×2 IMPLANT
DEVICE SECURE STRAP 25 ABSORB (INSTRUMENTS) IMPLANT
DRAPE LAPAROSCOPIC ABDOMINAL (DRAPES) ×2 IMPLANT
DRAPE WARM FLUID 44X44 (DRAPE) ×2 IMPLANT
DRSG TEGADERM 2-3/8X2-3/4 SM (GAUZE/BANDAGES/DRESSINGS) ×4 IMPLANT
DRSG TEGADERM 4X4.75 (GAUZE/BANDAGES/DRESSINGS) ×2 IMPLANT
ELECT REM PT RETURN 9FT ADLT (ELECTROSURGICAL) ×2
ELECTRODE REM PT RTRN 9FT ADLT (ELECTROSURGICAL) ×1 IMPLANT
GLOVE ECLIPSE 8.0 STRL XLNG CF (GLOVE) ×12 IMPLANT
GLOVE INDICATOR 8.0 STRL GRN (GLOVE) ×2 IMPLANT
GOWN STRL REUS W/TWL XL LVL3 (GOWN DISPOSABLE) ×6 IMPLANT
KIT BASIN OR (CUSTOM PROCEDURE TRAY) ×2 IMPLANT
MESH ULTRAPRO 6X6 15CM15CM (Mesh General) ×4 IMPLANT
SCISSORS LAP 5X35 DISP (ENDOMECHANICALS) ×2 IMPLANT
SET IRRIG TUBING LAPAROSCOPIC (IRRIGATION / IRRIGATOR) IMPLANT
SLEEVE XCEL OPT CAN 5 100 (ENDOMECHANICALS) ×2 IMPLANT
SUT MNCRL AB 4-0 PS2 18 (SUTURE) ×2 IMPLANT
SUT VIC AB 3-0 SH 27 (SUTURE)
SUT VIC AB 3-0 SH 27XBRD (SUTURE) IMPLANT
SUT VICRYL 0 UR6 27IN ABS (SUTURE) ×2 IMPLANT
TACKER 5MM HERNIA 3.5CML NAB (ENDOMECHANICALS) IMPLANT
TOWEL OR 17X26 10 PK STRL BLUE (TOWEL DISPOSABLE) ×2 IMPLANT
TRAY LAPAROSCOPIC (CUSTOM PROCEDURE TRAY) ×2 IMPLANT
TROCAR BLADELESS OPT 5 100 (ENDOMECHANICALS) ×2 IMPLANT
TROCAR XCEL BLUNT TIP 100MML (ENDOMECHANICALS) ×2 IMPLANT

## 2014-05-30 NOTE — Anesthesia Postprocedure Evaluation (Signed)
  Anesthesia Post-op Note  Patient: Richard Ware  Procedure(s) Performed: Procedure(s) (LRB): LAPAROSCOPIC INGUINAL HERNIA (Bilateral) INSERTION OF MESH (Bilateral)  Patient Location: PACU  Anesthesia Type: General  Level of Consciousness: awake and alert   Airway and Oxygen Therapy: Patient Spontanous Breathing  Post-op Pain: mild  Post-op Assessment: Post-op Vital signs reviewed, Patient's Cardiovascular Status Stable, Respiratory Function Stable, Patent Airway and No signs of Nausea or vomiting  Last Vitals:  Filed Vitals:   05/30/14 1146  BP: 128/83  Pulse: 59  Temp: 36.7 C  Resp: 14    Post-op Vital Signs: stable   Complications: No apparent anesthesia complications

## 2014-05-30 NOTE — Progress Notes (Signed)
Patient up ambulating in hallway s/p bilateral inguinal hernia repair. Able to void in BR. Tolerating fluids. Reviewing discharge instructions with wife. She has appointment at cancer center and then will return.

## 2014-05-30 NOTE — H&P (Signed)
Braxxton Stoudt 03/15/2014 11:43 AM Location: [facility] Patient #: [id number] DOB: 1960/08/26 Married / Language: [name] / Race: White Male  History of Present Illness [name]; 03/15/2014 12:07 PM) The patient is a 54 year old male who presents with an inguinal hernia. Patient sent by primary care physician, Dr. Annye Asa, for concerns of RIGHT inguinal hernia. Pleasant active male. Uses treadmill and weights on a daily basis. Avid golfer. Noted some RIGHT groin bulging after opening moving into a house in December. It is reducible. However it intermittently pops out. Can cause some pain and tingling. Concerned him. Discuss with his primary care physician. Concern of inguinal hernia. Surgical consultation requested. Patient had open appendectomy but no other abdominal surgeries. Normally has bowel movement every day. Had colonoscopy two years ago that noted some small tubular adenomas. Three-year follow-up recommended. He cannot recall that. We went and printed out the colonoscopy and path reports for him. Patient can walk 5 miles without difficulty. No history of Crohn's or colitis. No history of MRSA skin infections.   Other Problems [name], MA; 03/15/2014 11:43 AM) High blood pressure  Past Surgical History Lars Mage [place], MA; 03/15/2014 11:43 AM) Appendectomy  Diagnostic Studies History [name], MA; 03/15/2014 11:43 AM) Colonoscopy 1-5 years ago  Allergies [name], MA; 03/15/2014 11:45 AM) No Known Drug Allergies03/01/2014  Medication History ([name], MA; 03/15/2014 11:45 AM) AmLODIPine Besylate (10MG  Tablet, Oral) Active. Acitretin (25MG  Capsule, Oral) Active. Losartan Potassium-HCTZ (100-12.5MG  Tablet, Oral) Active. Meloxicam (15MG  Tablet, Oral) Active. Medications Reconciled  Social History [name], MD; 03/15/2014 12:09 PM) Alcohol use Occasional alcohol use. Caffeine use Coffee. No  drug use Tobacco use Never smoker. originally from a [place]. Has lived in [place] and [place], [place]. Lives in [place] since 2008  Family History Lars Mage [place], [place]; 03/15/2014 11:43 AM) Family history unknown First Degree Relatives  Review of Systems Lars Mage [place] MA; 03/15/2014 11:43 AM) General Not Present- Appetite Loss, Chills, Fatigue, Fever, Night Sweats, Weight Gain and Weight Loss. Skin Not Present- Change in Wart/Mole, Dryness, Hives, Jaundice, New Lesions, Non-Healing Wounds, Rash and Ulcer. HEENT Not Present- Earache, Hearing Loss, Hoarseness, Nose Bleed, Oral Ulcers, Ringing in the Ears, Seasonal Allergies, Sinus Pain, Sore Throat, Visual Disturbances, Wears glasses/contact lenses and Yellow Eyes. Respiratory Not Present- Bloody sputum, Chronic Cough, Difficulty Breathing, Snoring and Wheezing. Breast Not Present- Breast Mass, Breast Pain, Nipple Discharge and Skin Changes. Cardiovascular Not Present- Chest Pain, Difficulty Breathing Lying Down, Leg Cramps, Palpitations, Rapid Heart Rate, Shortness of Breath and Swelling of Extremities. Gastrointestinal Not Present- Abdominal Pain, Bloating, Bloody Stool, Change in Bowel Habits, Chronic diarrhea, Constipation, Difficulty Swallowing, Excessive gas, Gets full quickly at meals, Hemorrhoids, Indigestion, Nausea, Rectal Pain and Vomiting. Male Genitourinary Not Present- Blood in Urine, Change in Urinary Stream, Frequency, Impotence, Nocturia, Painful Urination, Urgency and Urine Leakage. Musculoskeletal Not Present- Back Pain, Joint Pain, Joint Stiffness, Muscle Pain, Muscle Weakness and Swelling of Extremities. Neurological Not Present- Decreased Memory, Fainting, Headaches, Numbness, Seizures, Tingling, Tremor, Trouble walking and Weakness. Psychiatric Not Present- Anxiety, Bipolar, Change in Sleep Pattern, Depression, Fearful and Frequent crying. Endocrine Not Present- Cold Intolerance,  Excessive Hunger, Hair Changes, Heat Intolerance, Hot flashes and New Diabetes. Hematology Not Present- Easy Bruising, Excessive bleeding, Gland problems, HIV and Persistent Infections.   Vitals ([name]; 03/15/2014 11:45 AM) 03/15/2014 11:44 AM Weight: 211 lb Height: 75in Body Surface Area: 2.25 m Body Mass Index: 26.37 kg/m Pulse: 58 (Regular)  BP: 138/82 (  Sitting, Left Arm, Standard)    Physical Exam [name]; 03/15/2014 12:05 PM) General Mental Status-Alert. General Appearance-Not in acute distress, Not Sickly. Orientation-Oriented X3. Hydration-Well hydrated. Voice-Normal.  Integumentary Global Assessment Upon inspection and palpation of skin surfaces of the - Axillae: non-tender, no inflammation or ulceration, no drainage. and Distribution of scalp and body hair is normal. General Characteristics Temperature - normal warmth is noted.  Head and Neck Head-normocephalic, atraumatic with no lesions or palpable masses. Face Global Assessment - atraumatic, no absence of expression. Neck Global Assessment - no abnormal movements, no bruit auscultated on the right, no bruit auscultated on the left, no decreased range of motion, non-tender. Trachea-midline. Thyroid Gland Characteristics - non-tender.  Eye Eyeball - Left-Extraocular movements intact, No Nystagmus. Eyeball - Right-Extraocular movements intact, No Nystagmus. Cornea - Left-No Hazy. Cornea - Right-No Hazy. Sclera/Conjunctiva - Left-No scleral icterus, No Discharge. Sclera/Conjunctiva - Right-No scleral icterus, No Discharge. Pupil - Left-Direct reaction to light normal. Pupil - Right-Direct reaction to light normal.  ENMT Ears Pinna - Left - no drainage observed, no generalized tenderness observed. Right - no drainage observed, no generalized tenderness observed. Nose and Sinuses External Inspection of the Nose - no destructive lesion observed.  Inspection of the nares - Left - quiet respiration. Right - quiet respiration. Mouth and Throat Lips - Upper Lip - no fissures observed, no pallor noted. Lower Lip - no fissures observed, no pallor noted. Nasopharynx - no discharge present. Oral Cavity/Oropharynx - Tongue - no dryness observed. Oral Mucosa - no cyanosis observed. Hypopharynx - no evidence of airway distress observed.  Chest and Lung Exam Inspection Movements - Normal and Symmetrical. Accessory muscles - No use of accessory muscles in breathing. Palpation Palpation of the chest reveals - Non-tender. Auscultation Breath sounds - Normal and Clear.  Cardiovascular Auscultation Rhythm - Regular. Murmurs & Other Heart Sounds - Auscultation of the heart reveals - No Murmurs and No Systolic Clicks.  Abdomen Inspection Inspection of the abdomen reveals - No Visible peristalsis and No Abnormal pulsations. Umbilicus - No Bleeding, No Urine drainage. Palpation/Percussion Palpation and Percussion of the abdomen reveal - Soft, Non Tender, No Rebound tenderness, No Rigidity (guarding) and No Cutaneous hyperesthesia. Note: Well-healed oblique scar RIGHT lower quadrant consistent with prior appendectomy. No umbilical hernia. No diastases. No abdominal pain. No guarding.   Male Genitourinary Sexual Maturity Tanner 5 - Adult hair pattern and Adult penile size and shape. Note: Circumcised male. Testes epididymides and cords normal.  Obvious RIGHT groin bulge reducible consistent with RIGHT inguinal hernia. Impulsed though at LEFT external inguinal ring consistent with small LEFT inguinal hernia.   Peripheral Vascular Upper Extremity Inspection - Left - No Cyanotic nailbeds, Not Ischemic. Right - No Cyanotic nailbeds, Not Ischemic.  Neurologic Neurologic evaluation reveals -normal attention span and ability to concentrate, able to name objects and repeat phrases. Appropriate fund of knowledge , normal sensation and normal  coordination. Mental Status Affect - not angry, not paranoid. Cranial Nerves-Normal Bilaterally. Gait-Normal.  Neuropsychiatric Mental status exam performed with findings of-able to articulate well with normal speech/language, rate, volume and coherence, thought content normal with ability to perform basic computations and apply abstract reasoning and no evidence of hallucinations, delusions, obsessions or homicidal/suicidal ideation.  Musculoskeletal Global Assessment Spine, Ribs and Pelvis - no instability, subluxation or laxity. Right Upper Extremity - no instability, subluxation or laxity.  Lymphatic Head & Neck  General Head & Neck Lymphatics: Bilateral - Description - No Localized lymphadenopathy. Axillary  General  Axillary Region: Bilateral - Description - No Localized lymphadenopathy. Femoral & Inguinal  Generalized Femoral & Inguinal Lymphatics: Left - Description - No Localized lymphadenopathy. Right - Description - No Localized lymphadenopathy.    Assessment & Plan [name]; 03/15/2014 12:03 PM) BILATERAL INGUINAL HERNIA WITHOUT OBSTRUCTION OR GANGRENE, RECURRENCE NOT SPECIFIED (550.92  K40.20) Impression: Definite RIGHT and probable LEFT inguinal hernias. I think he would benefit from surgical exploration and repair with mesh. Long discussion with him. He agrees and wishes to be aggressive. Because he is an avid golfer and it is a quiet time, he like to try and get it done sooner versus later. Interested in laparoscopic approach. Literature given to him. Current Plans  Schedule for Surgery Discussed regular exercise with patient. The anatomy & physiology of the abdominal wall and pelvic floor was discussed. The pathophysiology of hernias in the inguinal and pelvic region was discussed. Natural history risks such as progressive enlargement, pain, incarceration, and strangulation was discussed. Contributors to complications such as smoking, obesity,  diabetes, prior surgery, etc were discussed.  I feel the risks of no intervention will lead to serious problems that outweigh the operative risks; therefore, I recommended surgery to reduce and repair the hernia. I explained laparoscopic techniques with possible need for an open approach. I noted usual use of mesh to patch and/or buttress hernia repair  Risks such as bleeding, infection, abscess, need for further treatment, heart attack, death, and other risks were discussed. I noted a good likelihood this will help address the problem. Goals of post-operative recovery were discussed as well. Possibility that this will not correct all symptoms was explained. I stressed the importance of low-impact activity, aggressive pain control, avoiding constipation, & not pushing through pain to minimize risk of post-operative chronic pain or injury. Possibility of reherniation was discussed. We will work to minimize complications.  An educational handout further explaining the pathology & treatment options was given as well. Questions were answered. The patient expresses understanding & wishes to proceed with surgery. Pt Education - [facility] Hernia Post-Op HCI (Jess Toney): discussed with patient and provided information. Pt Education - [facility] Good Bowel Health ([facility]) Pt Education - [facility] Pain Control (Jader Desai)  [name], M.D., F.A.C.S. Gastrointestinal and Minimally Invasive Surgery [facility] [address], [place], [place] [address] [phone] Main / Paging

## 2014-05-30 NOTE — Anesthesia Procedure Notes (Signed)
Procedure Name: Intubation Date/Time: 05/30/2014 7:36 AM Performed by: Lind Covert Pre-anesthesia Checklist: Patient identified, Emergency Drugs available, Suction available, Patient being monitored and Timeout performed Patient Re-evaluated:Patient Re-evaluated prior to inductionOxygen Delivery Method: Circle system utilized Preoxygenation: Pre-oxygenation with 100% oxygen Intubation Type: IV induction Ventilation: Mask ventilation without difficulty Laryngoscope Size: Mac and 4 Grade View: Grade I Tube type: Oral Tube size: 7.5 mm Number of attempts: 1 Airway Equipment and Method: Stylet Placement Confirmation: ETT inserted through vocal cords under direct vision,  breath sounds checked- equal and bilateral and positive ETCO2 Secured at: 23 cm Tube secured with: Tape Dental Injury: Teeth and Oropharynx as per pre-operative assessment

## 2014-05-30 NOTE — Discharge Instructions (Signed)
HERNIA REPAIR: POST OP INSTRUCTIONS ° °1. DIET: Follow a light bland diet the first 24 hours after arrival home, such as soup, liquids, crackers, etc.  Be sure to include lots of fluids daily.  Avoid fast food or heavy meals as your are more likely to get nauseated.  Eat a low fat the next few days after surgery. °2. Take your usually prescribed home medications unless otherwise directed. °3. PAIN CONTROL: °a. Pain is best controlled by a usual combination of three different methods TOGETHER: °i. Ice/Heat °ii. Over the counter pain medication °iii. Prescription pain medication °b. Most patients will experience some swelling and bruising around the hernia(s) such as the bellybutton, groins, or old incisions.  Ice packs or heating pads (30-60 minutes up to 6 times a day) will help. Use ice for the first few days to help decrease swelling and bruising, then switch to heat to help relax tight/sore spots and speed recovery.  Some people prefer to use ice alone, heat alone, alternating between ice & heat.  Experiment to what works for you.  Swelling and bruising can take several weeks to resolve.   °c. It is helpful to take an over-the-counter pain medication regularly for the first few weeks.  Choose one of the following that works best for you: °i. Naproxen (Aleve, etc)  Two 220mg tabs twice a day °ii. Ibuprofen (Advil, etc) Three 200mg tabs four times a day (every meal & bedtime) °iii. Acetaminophen (Tylenol, etc) 325-650mg four times a day (every meal & bedtime) °d. A  prescription for pain medication should be given to you upon discharge.  Take your pain medication as prescribed.  °i. If you are having problems/concerns with the prescription medicine (does not control pain, nausea, vomiting, rash, itching, etc), please call us [phone] to see if we need to switch you to a different pain medicine that will work better for you and/or control your side effect better. °ii. If you need a refill on your pain  medication, please contact your pharmacy.  They will contact our office to request authorization. Prescriptions will not be filled after 5 pm or on week-ends. °4. Avoid getting constipated.  Between the surgery and the pain medications, it is common to experience some constipation.  Increasing fluid intake and taking a fiber supplement (such as Metamucil, Citrucel, FiberCon, MiraLax, etc) 1-2 times a day regularly will usually help prevent this problem from occurring.  A mild laxative (prune juice, Milk of Magnesia, MiraLax, etc) should be taken according to package directions if there are no bowel movements after 48 hours.   °5. Wash / shower every day.  You may shower over the dressings as they are waterproof.   °6. Remove your waterproof bandages 5 days after surgery.  You may leave the incision open to air.  You may replace a dressing/Band-Aid to cover the incision for comfort if you wish.  Continue to shower over incision(s) after the dressing is off. ° ° ° °7. ACTIVITIES as tolerated:   °a. You may resume regular (light) daily activities beginning the next day--such as daily self-care, walking, climbing stairs--gradually increasing activities as tolerated.  If you can walk 30 minutes without difficulty, it is safe to try more intense activity such as jogging, treadmill, bicycling, low-impact aerobics, swimming, etc. °b. Save the most intensive and strenuous activity for last such as sit-ups, heavy lifting, contact sports, etc  Refrain from any heavy lifting or straining until you are off narcotics for pain control.   °  c. DO NOT PUSH THROUGH PAIN.  Let pain be your guide: If it hurts to do something, don't do it.  Pain is your body warning you to avoid that activity for another week until the pain goes down. d. You may drive when you are no longer taking prescription pain medication, you can comfortably wear a seatbelt, and you can safely maneuver your car and apply brakes. e. Dennis Bast may have sexual intercourse  when it is comfortable.  8. FOLLOW UP in our office a. Please call [facility] at [phone] to set up an appointment to see your surgeon in the office for a follow-up appointment approximately 2-3 weeks after your surgery. b. Make sure that you call for this appointment the day you arrive home to insure a convenient appointment time. 9.  IF YOU HAVE DISABILITY OR FAMILY LEAVE FORMS, BRING THEM TO THE OFFICE FOR PROCESSING.  DO NOT GIVE THEM TO YOUR DOCTOR.  WHEN TO CALL us [phone]: 1. Poor pain control 2. Reactions / problems with new medications (rash/itching, nausea, etc)  3. Fever over 101.5 F (38.5 C) 4. Inability to urinate 5. Nausea and/or vomiting 6. Worsening swelling or bruising 7. Continued bleeding from incision. 8. Increased pain, redness, or drainage from the incision   The clinic staff is available to answer your questions during regular business hours (8:30am-5pm).  Please dont hesitate to call and ask to speak to one of our nurses for clinical concerns.   If you have a medical emergency, go to the nearest emergency room or call 911.  A surgeon from [facility] is always on call at the hospitals in [facility], [place], [place], [place], [place]  [address] ?  [address] [address], [place], [place]   [address] MAIN: [phone] ? TOLL FREE: [phone] ? FAX: [phone] [email or web address]  GETTING TO GOOD BOWEL HEALTH. Irregular bowel habits such as constipation and diarrhea can lead to many problems over time.  Having one soft bowel movement a day is the most important way to prevent further problems.  The anorectal canal is designed to handle stretching and feces to safely manage our ability to get rid of solid waste (feces, poop, stool) out of our body.  BUT, hard constipated stools can act like ripping concrete bricks and diarrhea can be a burning fire to this very sensitive area of our body, causing  inflamed hemorrhoids, anal fissures, increasing risk is perirectal abscesses, abdominal pain/bloating, an making irritable bowel worse.     The goal: ONE SOFT BOWEL MOVEMENT A DAY!  To have soft, regular bowel movements:   Drink at least 8 tall glasses of water a day.    Take plenty of fiber.  Fiber is the undigested part of plant food that passes into the colon, acting s natures broom to encourage bowel motility and movement.  Fiber can absorb and hold large amounts of water. This results in a larger, bulkier stool, which is soft and easier to pass. Work gradually over several weeks up to 6 servings a day of fiber (25g a day even more if needed) in the form of: o Vegetables -- Root (potatoes, carrots, turnips), leafy green (lettuce, salad greens, celery, spinach), or cooked high residue (cabbage, broccoli, etc) o Fruit -- Fresh (unpeeled skin & pulp), Dried (prunes, apricots, cherries, etc ),  or stewed ( applesauce)  o Whole grain breads, pasta, etc (whole wheat)  o Bran cereals   Bulking Agents --  This type of water-retaining fiber generally is easily obtained each day by one of the following:  o Psyllium bran -- The psyllium plant is remarkable because its ground seeds can retain so much water. This product is available as Metamucil, Konsyl, Effersyllium, Per Diem Fiber, or the less expensive generic preparation in drug and health food stores. Although labeled a laxative, it really is not a laxative.  o Methylcellulose -- This is another fiber derived from wood which also retains water. It is available as Citrucel. o Polyethylene Glycol - and artificial fiber commonly called Miralax or Glycolax.  It is helpful for people with gassy or bloated feelings with regular fiber o Flax Seed - a less gassy fiber than psyllium  No reading or other relaxing activity while on the toilet. If bowel movements take longer than 5 minutes, you are too constipated  AVOID CONSTIPATION.  High fiber and water  intake usually takes care of this.  Sometimes a laxative is needed to stimulate more frequent bowel movements, but   Laxatives are not a good long-term solution as it can wear the colon out. o Osmotics (Milk of Magnesia, Fleets phosphosoda, Magnesium citrate, MiraLax, GoLytely) are safer than  o Stimulants (Senokot, Castor Oil, Dulcolax, Ex Lax)    o Do not take laxatives for more than 7days in a row.   IF SEVERELY CONSTIPATED, try a Bowel Retraining Program: o Do not use laxatives.  o Eat a diet high in roughage, such as bran cereals and leafy vegetables.  o Drink six (6) ounces of prune or apricot juice each morning.  o Eat two (2) large servings of stewed fruit each day.  o Take one (1) heaping tablespoon of a psyllium-based bulking agent twice a day. Use sugar-free sweetener when possible to avoid excessive calories.  o Eat a normal breakfast.  o Set aside 15 minutes after breakfast to sit on the toilet, but do not strain to have a bowel movement.  o If you do not have a bowel movement by the third day, use an enema and repeat the above steps.   Controlling diarrhea o Switch to liquids and simpler foods for a few days to avoid stressing your intestines further. o Avoid dairy products (especially milk & ice cream) for a short time.  The intestines often can lose the ability to digest lactose when stressed. o Avoid foods that cause gassiness or bloating.  Typical foods include beans and other legumes, cabbage, broccoli, and dairy foods.  Every person has some sensitivity to other foods, so listen to our body and avoid those foods that trigger problems for you. o Adding fiber (Citrucel, Metamucil, psyllium, Miralax) gradually can help thicken stools by absorbing excess fluid and retrain the intestines to act more normally.  Slowly increase the dose over a few weeks.  Too much fiber too soon can backfire and cause cramping & bloating. o Probiotics (such as active yogurt, Align, etc) may help  repopulate the intestines and colon with normal bacteria and calm down a sensitive digestive tract.  Most studies show it to be of mild help, though, and such products can be costly. o Medicines: - Bismuth subsalicylate (ex. Kayopectate, Pepto Bismol) every 30 minutes for up to 6 doses can help control diarrhea.  Avoid if pregnant. - Loperamide (Immodium) can slow down diarrhea.  Start with two tablets (4mg  total) first and then try one tablet every 6 hours.  Avoid if you are having fevers or severe pain.  If you are  not better or start feeling worse, stop all medicines and call your doctor for advice o Call your doctor if you are getting worse or not better.  Sometimes further testing (cultures, endoscopy, X-ray studies, bloodwork, etc) may be needed to help diagnose and treat the cause of the diarrhea.  Managing Pain  Pain after surgery or related to activity is often due to strain/injury to muscle, tendon, nerves and/or incisions.  This pain is usually short-term and will improve in a few months.   Many people find it helpful to do the following things TOGETHER to help speed the process of healing and to get back to regular activity more quickly:  1. Avoid heavy physical activity at first a. No lifting greater than 20 pounds at first, then increase to lifting as tolerated over the next few weeks b. Do not push through the pain.  Listen to your body and avoid positions and maneuvers than reproduce the pain.  Wait a few days before trying something more intense c. Walking is okay as tolerated, but go slowly and stop when getting sore.  If you can walk 30 minutes without stopping or pain, you can try more intense activity (running, jogging, aerobics, cycling, swimming, treadmill, sex, sports, weightlifting, etc ) d. Remember: If it hurts to do it, then dont do it!  2. Take Anti-inflammatory medication a. Choose ONE of the following over-the-counter medications: i.            Acetaminophen  500mg  tabs (Tylenol) 1-2 pills with every meal and just before bedtime (avoid if you have liver problems) ii.            Naproxen 220mg  tabs (ex. Aleve) 1-2 pills twice a day (avoid if you have kidney, stomach, IBD, or bleeding problems) iii. Ibuprofen 200mg  tabs (ex. Advil, Motrin) 3-4 pills with every meal and just before bedtime (avoid if you have kidney, stomach, IBD, or bleeding problems) b. Take with food/snack around the clock for 1-2 weeks i. This helps the muscle and nerve tissues become less irritable and calm down faster  3. Use a Heating pad or Ice/Cold Pack a. 4-6 times a day b. May use warm bath/hottub  or showers  4. Try Gentle Massage and/or Stretching  a. at the area of pain many times a day b. stop if you feel pain - do not overdo it  Try these steps together to help you body heal faster and avoid making things get worse.  Doing just one of these things may not be enough.    If you are not getting better after two weeks or are noticing you are getting worse, contact our office for further advice; we may need to re-evaluate you & see what other things we can do to help.  Inguinal Hernia, Adult Muscles help keep everything in the body in its proper place. But if a weak spot in the muscles develops, something can poke through. That is called a hernia. When this happens in the lower part of the belly (abdomen), it is called an inguinal hernia. (It takes its name from a part of the body in this region called the inguinal canal.) A weak spot in the wall of muscles lets some fat or part of the small intestine bulge through. An inguinal hernia can develop at any age. Men get them more often than women. CAUSES  In adults, an inguinal hernia develops over time.  It can be triggered by:  Suddenly straining the muscles of the lower  abdomen.  Lifting heavy objects.  Straining to have a bowel movement. Difficult bowel movements (constipation) can lead to this.  Constant coughing.  This may be caused by smoking or lung disease.  Being overweight.  Being pregnant.  Working at a job that requires long periods of standing or heavy lifting.  Having had an inguinal hernia before. One type can be an emergency situation. It is called a strangulated inguinal hernia. It develops if part of the small intestine slips through the weak spot and cannot get back into the abdomen. The blood supply can be cut off. If that happens, part of the intestine may die. This situation requires emergency surgery. SYMPTOMS  Often, a small inguinal hernia has no symptoms. It is found when a healthcare provider does a physical exam. Larger hernias usually have symptoms.   In adults, symptoms may include:  A lump in the groin. This is easier to see when the person is standing. It might disappear when lying down.  In men, a lump in the scrotum.  Pain or burning in the groin. This occurs especially when lifting, straining or coughing.  A dull ache or feeling of pressure in the groin.  Signs of a strangulated hernia can include:  A bulge in the groin that becomes very painful and tender to the touch.  A bulge that turns red or purple.  Fever, nausea and vomiting.  Inability to have a bowel movement or to pass gas. DIAGNOSIS  To decide if you have an inguinal hernia, a healthcare provider will probably do a physical examination.  This will include asking questions about any symptoms you have noticed.  The healthcare provider might feel the groin area and ask you to cough. If an inguinal hernia is felt, the healthcare provider may try to slide it back into the abdomen.  Usually no other tests are needed. TREATMENT  Treatments can vary. The size of the hernia makes a difference. Options include:  Watchful waiting. This is often suggested if the hernia is small and you have had no symptoms.  No medical procedure will be done unless symptoms develop.  You will need to watch closely for  symptoms. If any occur, contact your healthcare provider right away.  Surgery. This is used if the hernia is larger or you have symptoms.  Open surgery. This is usually an outpatient procedure (you will not stay overnight in a hospital). An cut (incision) is made through the skin in the groin. The hernia is put back inside the abdomen. The weak area in the muscles is then repaired by herniorrhaphy or hernioplasty. Herniorrhaphy: in this type of surgery, the weak muscles are sewn back together. Hernioplasty: a patch or mesh is used to close the weak area in the abdominal wall.  Laparoscopy. In this procedure, a surgeon makes small incisions. A thin tube with a tiny video camera (called a laparoscope) is put into the abdomen. The surgeon repairs the hernia with mesh by looking with the video camera and using two long instruments. HOME CARE INSTRUCTIONS   After surgery to repair an inguinal hernia:  You will need to take pain medicine prescribed by your healthcare provider. Follow all directions carefully.  You will need to take care of the wound from the incision.  Your activity will be restricted for awhile. This will probably include no heavy lifting for several weeks. You also should not do anything too active for a few weeks. When you can return to work will depend on  the type of job that you have.  During "watchful waiting" periods, you should:  Maintain a healthy weight.  Eat a diet high in fiber (fruits, vegetables and whole grains).  Drink plenty of fluids to avoid constipation. This means drinking enough water and other liquids to keep your urine clear or pale yellow.  Do not lift heavy objects.  Do not stand for long periods of time.  Quit smoking. This should keep you from developing a frequent cough. SEEK MEDICAL CARE IF:   A bulge develops in your groin area.  You feel pain, a burning sensation or pressure in the groin. This might be worse if you are lifting or  straining.  You develop a fever of more than 100.5 F (38.1 C). SEEK IMMEDIATE MEDICAL CARE IF:   Pain in the groin increases suddenly.  A bulge in the groin gets bigger suddenly and does not go down.  For men, there is sudden pain in the scrotum. Or, the size of the scrotum increases.  A bulge in the groin area becomes red or purple and is painful to touch.  You have nausea or vomiting that does not go away.  You feel your heart beating much faster than normal.  You cannot have a bowel movement or pass gas.  You develop a fever of more than 102.0 F (38.9 C). Document Released: 05/19/2008 Document Revised: 03/25/2011 Document Reviewed: 05/19/2008 [facility] Patient Information 2015 [place], [place]. This information is not intended to replace advice given to you by your health care provider. Make sure you discuss any questions you have with your health care provider.    General Anesthesia, Care After Refer to this sheet in the next few weeks. These instructions provide you with information on caring for yourself after your procedure. Your health care provider may also give you more specific instructions. Your treatment has been planned according to current medical practices, but problems sometimes occur. Call your health care provider if you have any problems or questions after your procedure. WHAT TO EXPECT AFTER THE PROCEDURE After the procedure, it is typical to experience:  Sleepiness.  Nausea and vomiting. HOME CARE INSTRUCTIONS  For the first 24 hours after general anesthesia:  Have a responsible person with you.  Do not drive a car. If you are alone, do not take public transportation.  Do not drink alcohol.  Do not take medicine that has not been prescribed by your health care provider.  Do not sign important papers or make important decisions.  You may resume a normal diet and activities as directed by your health care provider.  Change bandages (dressings) as  directed.  If you have questions or problems that seem related to general anesthesia, call the hospital and ask for the anesthetist or anesthesiologist on call. SEEK MEDICAL CARE IF:  You have nausea and vomiting that continue the day after anesthesia.  You develop a rash. SEEK IMMEDIATE MEDICAL CARE IF:   You have difficulty breathing.  You have chest pain.  You have any allergic problems. Document Released: 04/08/2000 Document Revised: 01/05/2013 Document Reviewed: 07/16/2012 [facility] Patient Information 2015 [place], [place]. This information is not intended to replace advice given to you by your health care provider. Make sure you discuss any questions you have with your health care provider.

## 2014-05-30 NOTE — Transfer of Care (Signed)
Immediate Anesthesia Transfer of Care Note  Patient: Richard Ware  Procedure(s) Performed: Procedure(s): LAPAROSCOPIC INGUINAL HERNIA (Bilateral) INSERTION OF MESH (Bilateral)  Patient Location: PACU  Anesthesia Type:General  Level of Consciousness: sedated  Airway & Oxygen Therapy: Patient Spontanous Breathing and Patient connected to face mask oxygen  Post-op Assessment: Report given to RN and Post -op Vital signs reviewed and stable  Post vital signs: Reviewed and stable  Last Vitals:  Filed Vitals:   05/30/14 0545  BP:   Pulse:   Temp: 36.8 C  Resp:     Complications: No apparent anesthesia complications

## 2014-05-30 NOTE — Op Note (Signed)
05/30/2014  9:24 AM  PATIENT:  Richard Ware  54 y.o. male  Patient Care Team: Midge Minium, MD as PCP - General (Family Medicine)  PRE-OPERATIVE DIAGNOSIS:  bilateral inguinal hernia  POST-OPERATIVE DIAGNOSIS:    Bilateral inguinal hernia (R>L) Left femoral hernia  PROCEDURE:    LAPAROSCOPIC BILATERAL INGUINAL HERNIA REPAIR  LAPAROSCOPIC LEFT FEMORAL HERNIA REPAIR INSERTION OF MESH  SURGEON:  Surgeon(s): Michael Boston, MD  ASSISTANT: RN   ANESTHESIA:   Regional ilioinguinal and genitofemoral and spermatic cord nerve blocks with GETA  EBL:  Total I/O In: 800 [I.V.:800] Out: -   Delay start of Pharmacological VTE agent (>24hrs) due to surgical blood loss or risk of bleeding:  no  DRAINS: NONE  SPECIMEN:  NONE  DISPOSITION OF SPECIMEN:  N/A  COUNTS:  YES  PLAN OF CARE: Discharge to home after PACU  PATIENT DISPOSITION:  PACU - hemodynamically stable.  INDICATION:   The anatomy & physiology of the abdominal wall and pelvic floor was discussed.  The pathophysiology of hernias in the inguinal and pelvic region was discussed.  Natural history risks such as progressive enlargement, pain, incarceration & strangulation was discussed.   Contributors to complications such as smoking, obesity, diabetes, prior surgery, etc were discussed.    I feel the risks of no intervention will lead to serious problems that outweigh the operative risks; therefore, I recommended surgery to reduce and repair the hernia.  I explained laparoscopic techniques with possible need for an open approach.  I noted usual use of mesh to patch and/or buttress hernia repair  Risks such as bleeding, infection, abscess, need for further treatment, heart attack, death, and other risks were discussed.  I noted a good likelihood this will help address the problem.   Goals of post-operative recovery were discussed as well.  Possibility that this will not correct all symptoms was explained.  I stressed  the importance of low-impact activity, aggressive pain control, avoiding constipation, & not pushing through pain to minimize risk of post-operative chronic pain or injury. Possibility of reherniation was discussed.  We will work to minimize complications.     An educational handout further explaining the pathology & treatment options was given as well.  Questions were answered.  The patient expresses understanding & wishes to proceed with surgery.  OR FINDINGS: Patient had right greater than left indirect inguinal hernias.  Rather large right spermatic cord lipoma.  Smaller left spermatic cord lipoma.  Small but definite left femoral hernia.  No evidence of any right femoral or obturator hernias.  DESCRIPTION:   The patient was identified & brought into the operating room. The patient was positioned supine with arms tucked. SCDs were active during the entire case. The patient underwent general anesthesia without any difficulty.  The abdomen was prepped and draped in a sterile fashion. The patient's bladder was emptied.  A Surgical Timeout confirmed our plan.  I made a transverse incision through the inferior umbilical fold.  I made a small transverse nick through the anterior rectus fascia contralateral to the inguinal hernia side and placed a 0-vicryl stitch through the fascia.  I placed a Hasson trocar into the preperitoneal plane.  Entry was clean.  We induced carbon dioxide insufflation. Camera inspection revealed no injury.  I used a 88mm angled scope to bluntly free the peritoneum off the infraumbilical anterior abdominal wall.  I created enough of a preperitoneal pocket to place 38mm ports into the right & left mid-abdomen into this preperitoneal cavity.  I focused attention on the right side since that was the dominant hernia side.   I used blunt & focused sharp dissection to free the peritoneum off the flank and down to the pubic rim.  I freed the anteriolateral bladder wall off the  anteriolateral pelvic wall, sparing midline attachments.   I located a swath of peritoneum going into a hernia fascial defect at the internal ring consistent with an indirect inguinal hernia.  I gradually freed the peritoneal hernia sac off safely and reduced it into the preperitoneal space.  I freed the peritoneum off the spermatic vessels & vas deferens.  I freed peritoneum off the retroperitoneum along the psoas muscle.    I skeletonize off the hernia sac the spermatic cord lipoma and removed it.  Patient had a small direct space inguinal hernia as well I checked & assured hemostasis.    I turned attention on the opposite side.  I did dissection in a similar, mirror-image fashion. The patient had a smaller but definite indirect inguinal hernia.  No F and evidence of any direct space hernia.  Patient had a dilated left femoral foramen with hernia.  Femoral hernia.  On reinspection on the right contralateral side, there was no evidence of any right femoral hernia.  No evidence of obturator hernias noted on either side.  There were no breaches in peritoneum that required closure.  I chose 15x15 cm sheets of ultra-lightweight polypropylene mesh (Ultrapro), one for each side.  I cut a single sigmoid-shaped slit ~6cm from a corner of each mesh.  I placed the meshes into the preperitoneal space & laid them as overlapping diamonds such that at the inferior points, a 6x6 cm corner flap rested in the true anterolateral pelvis, covering the obturator & femoral foramina.   I allowed the bladder to fall back and help tuck the corners of the mesh in.  The medial corners overlapped each other across midline cephalad to the pubic rim.   This provided >2 inch coverage around the hernias.  Because the defects well covered and not particularly large, I did not need tacks to hold the mesh in place  I held the hernia sacs cephalad & evacuated carbon dioxide.  I closed the fascia  With absorbable suture.  I closed the skin using  4-0 monocryl stitch.  Sterile dressings were applied. The patient was extubated & arrived in the PACU in stable condition..  I had discussed postoperative care with the patient in the holding area.   I did discuss operative findings and postoperative goals / instructions to his wife as well.  Instructions are written in the chart.  [name], M.D., F.A.C.S. Gastrointestinal and Minimally Invasive Surgery [facility] [address] [place], [place] [address] [phone] Main / Paging

## 2014-05-30 NOTE — Interval H&P Note (Signed)
History and Physical Interval Note:  05/30/2014 7:31 AM  Richard Ware  has presented today for surgery, with the diagnosis of bilateral inguinal hernia  The various methods of treatment have been discussed with the patient and family. After consideration of risks, benefits and other options for treatment, the patient has consented to  Procedure(s): LAPAROSCOPIC INGUINAL HERNIA (Bilateral) INSERTION OF MESH (Bilateral) as a surgical intervention .  The patient's history has been reviewed, patient examined, no change in status, stable for surgery.  I have reviewed the patient's chart and labs.  Questions were answered to the patient's satisfaction.     Haynes Giannotti,Tobenna C.

## 2014-05-31 ENCOUNTER — Encounter (HOSPITAL_COMMUNITY): Payer: Self-pay | Admitting: Surgery

## 2014-10-25 ENCOUNTER — Ambulatory Visit (INDEPENDENT_AMBULATORY_CARE_PROVIDER_SITE_OTHER): Payer: BLUE CROSS/BLUE SHIELD | Admitting: Family Medicine

## 2014-10-25 ENCOUNTER — Encounter: Payer: Self-pay | Admitting: Family Medicine

## 2014-10-25 VITALS — BP 122/76 | HR 72 | Temp 98.1°F | Resp 16 | Ht 75.0 in | Wt 216.4 lb

## 2014-10-25 DIAGNOSIS — Z23 Encounter for immunization: Secondary | ICD-10-CM

## 2014-10-25 DIAGNOSIS — I10 Essential (primary) hypertension: Secondary | ICD-10-CM

## 2014-10-25 LAB — LIPID PANEL
CHOL/HDL RATIO: 3
Cholesterol: 184 mg/dL (ref 0–200)
HDL: 58.8 mg/dL (ref >39.00)
LDL Cholesterol: 112 mg/dL — ABNORMAL HIGH (ref 0–99)
NONHDL: 124.82
Triglycerides: 62 mg/dL (ref 0.0–149.0)
VLDL: 12.4 mg/dL (ref 0.0–40.0)

## 2014-10-25 LAB — CBC WITH DIFFERENTIAL/PLATELET
BASOS PCT: 0.7 % (ref 0.0–3.0)
Basophils Absolute: 0 10*3/uL (ref 0.0–0.1)
EOS PCT: 3.3 % (ref 0.0–5.0)
Eosinophils Absolute: 0.2 10*3/uL (ref 0.0–0.7)
HEMATOCRIT: 41.8 % (ref 39.0–52.0)
HEMOGLOBIN: 14.1 g/dL (ref 13.0–17.0)
LYMPHS PCT: 34.3 % (ref 12.0–46.0)
Lymphs Abs: 2 10*3/uL (ref 0.7–4.0)
MCHC: 33.7 g/dL (ref 30.0–36.0)
MCV: 93.2 fl (ref 78.0–100.0)
MONO ABS: 0.6 10*3/uL (ref 0.1–1.0)
Monocytes Relative: 10.8 % (ref 3.0–12.0)
Neutro Abs: 3 10*3/uL (ref 1.4–7.7)
Neutrophils Relative %: 50.9 % (ref 43.0–77.0)
PLATELETS: 239 10*3/uL (ref 150.0–400.0)
RBC: 4.49 Mil/uL (ref 4.22–5.81)
RDW: 12.8 % (ref 11.5–15.5)
WBC: 5.9 10*3/uL (ref 4.0–10.5)

## 2014-10-25 LAB — HEPATIC FUNCTION PANEL
ALT: 15 U/L (ref 0–53)
AST: 14 U/L (ref 0–37)
Albumin: 4.2 g/dL (ref 3.5–5.2)
Alkaline Phosphatase: 69 U/L (ref 39–117)
BILIRUBIN TOTAL: 0.9 mg/dL (ref 0.2–1.2)
Bilirubin, Direct: 0.2 mg/dL (ref 0.0–0.3)
Total Protein: 7.2 g/dL (ref 6.0–8.3)

## 2014-10-25 LAB — BASIC METABOLIC PANEL
BUN: 14 mg/dL (ref 6–23)
CHLORIDE: 104 meq/L (ref 96–112)
CO2: 25 mEq/L (ref 19–32)
Calcium: 9.6 mg/dL (ref 8.4–10.5)
Creatinine, Ser: 0.85 mg/dL (ref 0.40–1.50)
GFR: 99.63 mL/min (ref >60.00)
Glucose, Bld: 96 mg/dL (ref 70–99)
POTASSIUM: 3.7 meq/L (ref 3.5–5.1)
Sodium: 138 mEq/L (ref 135–145)

## 2014-10-25 NOTE — Patient Instructions (Signed)
Schedule your complete physical in 6 months We'll notify you of your lab results and make any changes if needed Keep up the good work on healthy diet and regular exercise- you look great! Call with any questions or concerns If you want to join us at the new [place] office, any scheduled appointments will automatically transfer and we will see you at [address], [place], [place] [address]  Happy Fall!!! 

## 2014-10-25 NOTE — Addendum Note (Signed)
Addended by: Davis Gourd on: 10/25/2014 09:21 AM   Modules accepted: Orders

## 2014-10-25 NOTE — Progress Notes (Signed)
   Subjective:    Patient ID: Richard Ware, male    DOB: 08-04-1960, 54 y.o.   MRN: [id number]  HPI HTN- chronic problem, on Losartan HCTZ and amlodipine w/ excellent control.  Pt continues to exercise and eat well.  Denies CP, SOB, HAs, visual changes, edema.  No abd pain, N/V.   Review of Systems For ROS see HPI     Objective:   Physical Exam  Constitutional: He is oriented to person, place, and time. He appears well-developed and well-nourished. No distress.  HENT:  Head: Normocephalic and atraumatic.  Eyes: Conjunctivae and EOM are normal. Pupils are equal, round, and reactive to light.  Neck: Normal range of motion. Neck supple. No thyromegaly present.  Cardiovascular: Normal rate, regular rhythm, normal heart sounds and intact distal pulses.   No murmur heard. Pulmonary/Chest: Effort normal and breath sounds normal. No respiratory distress.  Abdominal: Soft. Bowel sounds are normal. He exhibits no distension.  Musculoskeletal: He exhibits no edema.  Lymphadenopathy:    He has no cervical adenopathy.  Neurological: He is alert and oriented to person, place, and time. No cranial nerve deficit.  Skin: Skin is warm and dry.  Psychiatric: He has a normal mood and affect. His behavior is normal.  Vitals reviewed.         Assessment & Plan:

## 2014-10-25 NOTE — Progress Notes (Signed)
Pre visit review using our clinic review tool, if applicable. No additional management support is needed unless otherwise documented below in the visit note. 

## 2014-10-25 NOTE — Assessment & Plan Note (Signed)
Chronic problem.  Well controlled.  Asymptomatic.  Applauded his efforts at healthy diet and regular exercise.  Check labs.  No anticipated med changes.  Will follow.

## 2014-11-21 ENCOUNTER — Other Ambulatory Visit: Payer: Self-pay | Admitting: Family Medicine

## 2014-11-21 NOTE — Telephone Encounter (Signed)
Medication filled to pharmacy as requested.   

## 2015-04-27 ENCOUNTER — Encounter: Payer: Self-pay | Admitting: Family Medicine

## 2015-05-03 ENCOUNTER — Encounter: Payer: Self-pay | Admitting: Family Medicine

## 2015-05-03 ENCOUNTER — Ambulatory Visit (INDEPENDENT_AMBULATORY_CARE_PROVIDER_SITE_OTHER): Payer: BLUE CROSS/BLUE SHIELD | Admitting: Family Medicine

## 2015-05-03 VITALS — BP 130/78 | HR 68 | Temp 98.1°F | Resp 16 | Ht 75.0 in | Wt 214.2 lb

## 2015-05-03 DIAGNOSIS — Z0000 Encounter for general adult medical examination without abnormal findings: Secondary | ICD-10-CM

## 2015-05-03 LAB — CBC WITH DIFFERENTIAL/PLATELET
Basophils Absolute: 0 K/uL (ref 0.0–0.1)
Basophils Relative: 0.7 % (ref 0.0–3.0)
Eosinophils Absolute: 0.1 K/uL (ref 0.0–0.7)
Eosinophils Relative: 2.6 % (ref 0.0–5.0)
HCT: 42.6 % (ref 39.0–52.0)
Hemoglobin: 14.4 g/dL (ref 13.0–17.0)
Lymphocytes Relative: 31.2 % (ref 12.0–46.0)
Lymphs Abs: 1.8 K/uL (ref 0.7–4.0)
MCHC: 33.8 g/dL (ref 30.0–36.0)
MCV: 93.5 fl (ref 78.0–100.0)
Monocytes Absolute: 0.6 K/uL (ref 0.1–1.0)
Monocytes Relative: 10.5 % (ref 3.0–12.0)
Neutro Abs: 3.1 K/uL (ref 1.4–7.7)
Neutrophils Relative %: 55 % (ref 43.0–77.0)
Platelets: 239 K/uL (ref 150.0–400.0)
RBC: 4.55 Mil/uL (ref 4.22–5.81)
RDW: 12.9 % (ref 11.5–15.5)
WBC: 5.6 K/uL (ref 4.0–10.5)

## 2015-05-03 LAB — BASIC METABOLIC PANEL WITH GFR
BUN: 14 mg/dL (ref 6–23)
CO2: 27 meq/L (ref 19–32)
Calcium: 9.9 mg/dL (ref 8.4–10.5)
Chloride: 102 meq/L (ref 96–112)
Creatinine, Ser: 0.84 mg/dL (ref 0.40–1.50)
GFR: 100.81 mL/min
Glucose, Bld: 104 mg/dL — ABNORMAL HIGH (ref 70–99)
Potassium: 4 meq/L (ref 3.5–5.1)
Sodium: 137 meq/L (ref 135–145)

## 2015-05-03 LAB — LIPID PANEL
Cholesterol: 222 mg/dL — ABNORMAL HIGH (ref 0–200)
HDL: 70.8 mg/dL (ref >39.00)
LDL Cholesterol: 133 mg/dL — ABNORMAL HIGH (ref 0–99)
NONHDL: 150.89
Total CHOL/HDL Ratio: 3
Triglycerides: 88 mg/dL (ref 0.0–149.0)
VLDL: 17.6 mg/dL (ref 0.0–40.0)

## 2015-05-03 LAB — HEPATIC FUNCTION PANEL
ALT: 15 U/L (ref 0–53)
AST: 13 U/L (ref 0–37)
Albumin: 4.4 g/dL (ref 3.5–5.2)
Alkaline Phosphatase: 69 U/L (ref 39–117)
Bilirubin, Direct: 0.3 mg/dL (ref 0.0–0.3)
Total Bilirubin: 1.4 mg/dL — ABNORMAL HIGH (ref 0.2–1.2)
Total Protein: 7.1 g/dL (ref 6.0–8.3)

## 2015-05-03 LAB — TSH: TSH: 1.29 u[IU]/mL (ref 0.35–4.50)

## 2015-05-03 LAB — PSA: PSA: 0.76 ng/mL (ref 0.10–4.00)

## 2015-05-03 MED ORDER — LOSARTAN POTASSIUM-HCTZ 100-12.5 MG PO TABS: ORAL_TABLET | ORAL | Status: DC

## 2015-05-03 MED ORDER — AMLODIPINE BESYLATE 10 MG PO TABS: 10.0000 mg | ORAL_TABLET | Freq: Every day | ORAL | Status: DC

## 2015-05-03 NOTE — Assessment & Plan Note (Signed)
Pt's PE WNL.  UTD on colonoscopy, Tdap.  Check labs.  Anticipatory guidance provided.  

## 2015-05-03 NOTE — Progress Notes (Signed)
Pre visit review using our clinic review tool, if applicable. No additional management support is needed unless otherwise documented below in the visit note. 

## 2015-05-03 NOTE — Progress Notes (Signed)
   Subjective:    Patient ID: Richard Ware, male    DOB: 27-Oct-1960, 55 y.o.   MRN: [id number]  HPI CPE- UTD on colonoscopy, due for recall in July.  UTD on Tdap and flu.     Review of Systems Patient reports no vision/hearing changes, anorexia, fever ,adenopathy, persistant/recurrent hoarseness, swallowing issues, chest pain, palpitations, edema, persistant/recurrent cough, hemoptysis, dyspnea (rest,exertional, paroxysmal nocturnal), gastrointestinal  bleeding (melena, rectal bleeding), abdominal pain, excessive heart burn, GU symptoms (dysuria, hematuria, voiding/incontinence issues) syncope, focal weakness, memory loss, numbness & tingling, skin/hair/nail changes, depression, anxiety, abnormal bruising/bleeding, musculoskeletal symptoms/signs.     Objective:   Physical Exam BP 130/78 mmHg  Pulse 68  Temp(Src) 98.1 F (36.7 C) (Oral)  Resp 16  Ht 6\' 3"  (1.905 m)  Wt 214 lb 4 oz (97.183 kg)  BMI 26.78 kg/m2  SpO2 99%  General Appearance:    Alert, cooperative, no distress, appears stated age  Head:    Normocephalic, without obvious abnormality, atraumatic  Eyes:    PERRL, conjunctiva/corneas clear, EOM's intact, fundi    benign, both eyes       Ears:    Normal TM's and external ear canals, both ears  Nose:   Nares normal, septum midline, mucosa normal, no drainage   or sinus tenderness  Throat:   Lips, mucosa, and tongue normal; teeth and gums normal  Neck:   Supple, symmetrical, trachea midline, no adenopathy;       thyroid:  No enlargement/tenderness/nodules; no carotid   bruit or JVD  Back:     Symmetric, no curvature, ROM normal, no CVA tenderness  Lungs:     Clear to auscultation bilaterally, respirations unlabored  Chest wall:    No tenderness or deformity  Heart:    Regular rate and rhythm, S1 and S2 normal, no murmur, rub   or gallop  Abdomen:     Soft, non-tender, bowel sounds active all four quadrants,    no masses, no organomegaly  Genitalia:    Normal male  without lesion, discharge or tenderness  Rectal:    Normal tone, normal prostate, no masses or tenderness  Extremities:   Extremities normal, atraumatic, no cyanosis or edema  Pulses:   2+ and symmetric all extremities  Skin:   Skin color, texture, turgor normal, no rashes or lesions  Lymph nodes:   Cervical, supraclavicular, and axillary nodes normal  Neurologic:   CNII-XII intact. Normal strength, sensation and reflexes      throughout          Assessment & Plan:

## 2015-05-03 NOTE — Patient Instructions (Signed)
Follow up in 6 months to recheck BP We'll notify you of your lab results and make any changes if needed Keep up the good work on healthy diet and regular exercise- you look great! You are due for your repeat colonoscopy this summer- if you don't hear from them, let me know! Call with any questions or concerns Thanks for sticking with [place]! Happy Spring!!!

## 2015-06-22 ENCOUNTER — Encounter: Payer: Self-pay | Admitting: Gastroenterology

## 2015-07-25 ENCOUNTER — Encounter: Payer: Self-pay | Admitting: Gastroenterology

## 2015-08-15 DIAGNOSIS — L409 Psoriasis, unspecified: Secondary | ICD-10-CM | POA: Diagnosis not present

## 2015-08-15 DIAGNOSIS — Z79899 Other long term (current) drug therapy: Secondary | ICD-10-CM | POA: Diagnosis not present

## 2015-08-15 DIAGNOSIS — D1800 Hemangioma unspecified site: Secondary | ICD-10-CM | POA: Diagnosis not present

## 2015-08-15 DIAGNOSIS — L821 Other seborrheic keratosis: Secondary | ICD-10-CM | POA: Diagnosis not present

## 2015-08-15 DIAGNOSIS — L814 Other melanin hyperpigmentation: Secondary | ICD-10-CM | POA: Diagnosis not present

## 2015-08-28 DIAGNOSIS — H6121 Impacted cerumen, right ear: Secondary | ICD-10-CM | POA: Diagnosis not present

## 2015-09-13 ENCOUNTER — Ambulatory Visit (AMBULATORY_SURGERY_CENTER): Payer: BLUE CROSS/BLUE SHIELD | Admitting: *Deleted

## 2015-09-13 VITALS — Ht 75.0 in | Wt 215.6 lb

## 2015-09-13 DIAGNOSIS — Z86010 Personal history of colonic polyps: Secondary | ICD-10-CM

## 2015-09-13 MED ORDER — NA SULFATE-K SULFATE-MG SULF 17.5-3.13-1.6 GM/177ML PO SOLN
1.0000 | Freq: Once | ORAL | 0 refills | Status: AC
Start: 2015-09-13 — End: 2015-09-13

## 2015-09-13 NOTE — Progress Notes (Signed)
No allergies to eggs or soy. No problems with anesthesia.  Pt given Emmi instructions for colonoscopy  No oxygen use  No diet drug use  

## 2015-09-14 ENCOUNTER — Encounter: Payer: Self-pay | Admitting: Gastroenterology

## 2015-09-27 ENCOUNTER — Ambulatory Visit (AMBULATORY_SURGERY_CENTER): Payer: BLUE CROSS/BLUE SHIELD | Admitting: Gastroenterology

## 2015-09-27 ENCOUNTER — Encounter: Payer: Self-pay | Admitting: Gastroenterology

## 2015-09-27 VITALS — BP 111/70 | HR 47 | Temp 98.7°F | Resp 29 | Ht 75.0 in | Wt 215.0 lb

## 2015-09-27 DIAGNOSIS — Z86010 Personal history of colonic polyps: Secondary | ICD-10-CM | POA: Diagnosis not present

## 2015-09-27 DIAGNOSIS — D123 Benign neoplasm of transverse colon: Secondary | ICD-10-CM | POA: Diagnosis not present

## 2015-09-27 DIAGNOSIS — D125 Benign neoplasm of sigmoid colon: Secondary | ICD-10-CM

## 2015-09-27 DIAGNOSIS — D122 Benign neoplasm of ascending colon: Secondary | ICD-10-CM

## 2015-09-27 DIAGNOSIS — D124 Benign neoplasm of descending colon: Secondary | ICD-10-CM | POA: Diagnosis not present

## 2015-09-27 MED ORDER — SODIUM CHLORIDE 0.9 % IV SOLN: 500.0000 mL | INTRAVENOUS | Status: DC

## 2015-09-27 NOTE — Progress Notes (Signed)
Called to room to assist during endoscopic procedure.  Patient ID and intended procedure confirmed with present staff. Received instructions for my participation in the procedure from the performing physician.  

## 2015-09-27 NOTE — Progress Notes (Signed)
A/ox3, pleased with MAC, report to RN 

## 2015-09-27 NOTE — Patient Instructions (Signed)
YOU HAD AN ENDOSCOPIC PROCEDURE TODAY AT [facility]:   Refer to the procedure report that was given to you for any specific questions about what was found during the examination.  If the procedure report does not answer your questions, please call your gastroenterologist to clarify.  If you requested that your care partner not be given the details of your procedure findings, then the procedure report has been included in a sealed envelope for you to review at your convenience later.  YOU SHOULD EXPECT: Some feelings of bloating in the abdomen. Passage of more gas than usual.  Walking can help get rid of the air that was put into your GI tract during the procedure and reduce the bloating. If you had a lower endoscopy (such as a colonoscopy or flexible sigmoidoscopy) you may notice spotting of blood in your stool or on the toilet paper. If you underwent a bowel prep for your procedure, you may not have a normal bowel movement for a few days.  Please Note:  You might notice some irritation and congestion in your nose or some drainage.  This is from the oxygen used during your procedure.  There is no need for concern and it should clear up in a day or so.  SYMPTOMS TO REPORT IMMEDIATELY:   Following lower endoscopy (colonoscopy or flexible sigmoidoscopy):  Excessive amounts of blood in the stool  Significant tenderness or worsening of abdominal pains  Swelling of the abdomen that is new, acute  Fever of 100F or higher   Following upper endoscopy (EGD)  Vomiting of blood or coffee ground material  New chest pain or pain under the shoulder blades  Painful or persistently difficult swallowing  New shortness of breath  Fever of 100F or higher  Black, tarry-looking stools  For urgent or emergent issues, a gastroenterologist can be reached at any hour by calling [phone].   DIET:  We do recommend a small meal at first, but then you may proceed to your regular diet.  Drink  plenty of fluids but you should avoid alcoholic beverages for 24 hours.  ACTIVITY:  You should plan to take it easy for the rest of today and you should NOT DRIVE or use heavy machinery until tomorrow (because of the sedation medicines used during the test).    FOLLOW UP: Our staff will call the number listed on your records the next business day following your procedure to check on you and address any questions or concerns that you may have regarding the information given to you following your procedure. If we do not reach you, we will leave a message.  However, if you are feeling well and you are not experiencing any problems, there is no need to return our call.  We will assume that you have returned to your regular daily activities without incident.  If any biopsies were taken you will be contacted by phone or by letter within the next 1-3 weeks.  Please call us at [phone] if you have not heard about the biopsies in 3 weeks.    SIGNATURES/CONFIDENTIALITY: You and/or your care partner have signed paperwork which will be entered into your electronic medical record.  These signatures attest to the fact that that the information above on your After Visit Summary has been reviewed and is understood.  Full responsibility of the confidentiality of this discharge information lies with you and/or your care-partner.  Polyp, hemorrhoid information given.  No aspirin,ibuprofen, naproxen or any other  NSAIDS for 5 days after polyp removal.

## 2015-09-27 NOTE — Op Note (Signed)
[facility] Patient Name: Richard Ware Procedure Date: 09/27/2015 11:46 AM MRN: [id number] Endoscopist: Mallie Mussel L. Loletha Carrow , MD Age: 55 Referring MD:  Date of Birth: [birth year] Gender: Male Account #: [id number] Procedure:                Colonoscopy Indications:              Surveillance: Personal history of adenomatous                            polyps on last colonoscopy > 3 years ago (TA x 4 ,                            each < 1 cm, 06/2012) Medicines:                Monitored Anesthesia Care Procedure:                Pre-Anesthesia Assessment:                           - Prior to the procedure, a History and Physical                            was performed, and patient medications and                            allergies were reviewed. The patient's tolerance of                            previous anesthesia was also reviewed. The risks                            and benefits of the procedure and the sedation                            options and risks were discussed with the patient.                            All questions were answered, and informed consent                            was obtained. Prior Anticoagulants: The patient has                            taken no previous anticoagulant or antiplatelet                            agents. ASA Grade Assessment: II - A patient with                            mild systemic disease. After reviewing the risks                            and benefits, the patient was deemed in  satisfactory condition to undergo the procedure.                           After obtaining informed consent, the colonoscope                            was passed under direct vision. Throughout the                            procedure, the patient's blood pressure, pulse, and                            oxygen saturations were monitored continuously. The                            Model CF-HQ190L [id number]) scope was  introduced                            through the anus and advanced to the the cecum,                            identified by appendiceal orifice and ileocecal                            valve. The colonoscopy was performed without                            difficulty. The patient tolerated the procedure                            well. The quality of the bowel preparation was                            excellent. The ileocecal valve, appendiceal                            orifice, and rectum were photographed. The quality                            of the bowel preparation was evaluated using the                            BBPS [facility] Bowel Preparation Scale) with scores                            of: Right Colon = 2, Transverse Colon = 3 and Left                            Colon = 3. The total BBPS score equals 8. The bowel                            preparation used was SUPREP. Scope In: [id number] PM Scope Out: 12:29:27 PM Scope Withdrawal Time: 0 hours 19 minutes  2 seconds  Total Procedure Duration: 0 hours 23 minutes 8 seconds  Findings:                 The perianal and digital rectal examinations were                            normal.                           Two sessile polyps were found in the proximal                            transverse colon and proximal ascending colon. The                            polyps were 2 mm in size. These polyps were removed                            with a cold biopsy forceps. Resection and retrieval                            were complete.                           Five sessile polyps were found in the mid sigmoid                            colon, mid descending colon and distal transverse                            colon. The polyps were 2 to 4 mm in size. These                            polyps were removed with a cold snare. Resection                            and retrieval were complete.                           Internal hemorrhoids  were found during                            retroflexion. The hemorrhoids were Grade I                            (internal hemorrhoids that do not prolapse).                           The exam was otherwise without abnormality. Complications:            No immediate complications. Estimated Blood Loss:     Estimated blood loss: none. Impression:               - Two 2 mm polyps in the proximal transverse colon  and in the proximal ascending colon, removed with a                            cold biopsy forceps. Resected and retrieved.                           - Five 2 to 4 mm polyps in the mid sigmoid colon,                            in the mid descending colon and in the distal                            transverse colon, removed with a cold snare.                            Resected and retrieved.                           - Internal hemorrhoids.                           - The examination was otherwise normal. Recommendation:           - Patient has a contact number available for                            emergencies. The signs and symptoms of potential                            delayed complications were discussed with the                            patient. Return to normal activities tomorrow.                            Written discharge instructions were provided to the                            patient.                           - Resume previous diet.                           - No aspirin, ibuprofen, naproxen, or other                            non-steroidal anti-inflammatory drugs for 5 days                            after polyp removal.                           - Await pathology results.                           -  Repeat colonoscopy is recommended for                            surveillance. The colonoscopy date will be                            determined after pathology results from today's                            exam become available  for review. Simon Aaberg L. Loletha Carrow, MD 09/27/2015 12:37:56 PM This report has been signed electronically.

## 2015-09-28 ENCOUNTER — Telehealth: Payer: Self-pay | Admitting: *Deleted

## 2015-09-28 NOTE — Telephone Encounter (Signed)
  Follow up Call-  Call back number 09/27/2015  Post procedure Call Back phone  # [phone]  Permission to leave phone message Yes  Some recent data might be hidden     Patient questions:  Do you have a fever, pain , or abdominal swelling? No. Pain Score  0 *  Have you tolerated food without any problems? Yes.    Have you been able to return to your normal activities? Yes.    Do you have any questions about your discharge instructions: Diet   No. Medications  No. Follow up visit  No.  Do you have questions or concerns about your Care? No.  Actions: * If pain score is 4 or above: No action needed, pain <4.

## 2015-10-02 ENCOUNTER — Encounter: Payer: Self-pay | Admitting: Gastroenterology

## 2015-10-12 ENCOUNTER — Emergency Department (HOSPITAL_COMMUNITY): Payer: BLUE CROSS/BLUE SHIELD

## 2015-10-12 ENCOUNTER — Encounter (HOSPITAL_COMMUNITY): Payer: Self-pay | Admitting: Emergency Medicine

## 2015-10-12 ENCOUNTER — Emergency Department (HOSPITAL_COMMUNITY)
Admission: EM | Admit: 2015-10-12 | Discharge: 2015-10-12 | Disposition: A | Discharge status: Home / Self Care | Payer: BLUE CROSS/BLUE SHIELD | Attending: Emergency Medicine | Admitting: Emergency Medicine

## 2015-10-12 DIAGNOSIS — S42141A Displaced fracture of glenoid cavity of scapula, right shoulder, initial encounter for closed fracture: Secondary | ICD-10-CM

## 2015-10-12 DIAGNOSIS — Y929 Unspecified place or not applicable: Secondary | ICD-10-CM | POA: Diagnosis not present

## 2015-10-12 DIAGNOSIS — R404 Transient alteration of awareness: Secondary | ICD-10-CM | POA: Diagnosis not present

## 2015-10-12 DIAGNOSIS — Y999 Unspecified external cause status: Secondary | ICD-10-CM | POA: Diagnosis not present

## 2015-10-12 DIAGNOSIS — S0990XA Unspecified injury of head, initial encounter: Secondary | ICD-10-CM

## 2015-10-12 DIAGNOSIS — W19XXXA Unspecified fall, initial encounter: Secondary | ICD-10-CM

## 2015-10-12 DIAGNOSIS — S4991XA Unspecified injury of right shoulder and upper arm, initial encounter: Secondary | ICD-10-CM | POA: Diagnosis present

## 2015-10-12 DIAGNOSIS — M25551 Pain in right hip: Secondary | ICD-10-CM | POA: Diagnosis not present

## 2015-10-12 DIAGNOSIS — W108XXA Fall (on) (from) other stairs and steps, initial encounter: Secondary | ICD-10-CM | POA: Insufficient documentation

## 2015-10-12 DIAGNOSIS — Y939 Activity, unspecified: Secondary | ICD-10-CM | POA: Diagnosis not present

## 2015-10-12 DIAGNOSIS — I10 Essential (primary) hypertension: Secondary | ICD-10-CM | POA: Diagnosis not present

## 2015-10-12 DIAGNOSIS — R42 Dizziness and giddiness: Secondary | ICD-10-CM | POA: Diagnosis not present

## 2015-10-12 DIAGNOSIS — M25511 Pain in right shoulder: Secondary | ICD-10-CM | POA: Diagnosis not present

## 2015-10-12 DIAGNOSIS — S42151A Displaced fracture of neck of scapula, right shoulder, initial encounter for closed fracture: Secondary | ICD-10-CM

## 2015-10-12 MED ORDER — NAPROXEN 250 MG PO TABS: 250.0000 mg | ORAL_TABLET | Freq: Two times a day (BID) | ORAL | 0 refills | Status: DC

## 2015-10-12 MED ORDER — ACETAMINOPHEN 325 MG PO TABS
650.0000 mg | ORAL_TABLET | Freq: Once | ORAL | Status: AC
  Administered 2015-10-12: 650 mg via ORAL
  Filled 2015-10-12: qty 2

## 2015-10-12 NOTE — ED Provider Notes (Signed)
[place] DEPT Provider Note   CSN: [id number] Arrival date & time: 10/12/15  1220     History   Chief Complaint Chief Complaint  Patient presents with  . Fall    HPI Richard Ware is a 55 y.o. male.  Richard Ware is a 55 y.o. Male who presents to the emergency department via EMS after a trip and fall down a flight of stairs today. The patient reports he just had his carpets cleaned and the top stair is hardwood and was wet. He reports his foot slipped from under him and he fell down an entire flight of stairs on his right shoulder. He reports hitting his head but denies loss of consciousness. He denies any head or neck pain. Patient reports initially he got right back up in ambulated for several minutes. He reports then he felt anxious, lightheaded and sweaty. He has been nervous recently because a friend of his recently passed away from medical problems. He reports this caused him to be concerned. He called 911 and was brought to the emergency department. Currently complains of some right shoulder pain. He also complained of some right hip pain earlier but reports this is feeling better. He denies any lightheadedness or dizziness currently. He had no prodromal symptoms of chest pain or shortness of breath. He's had no chest pain or shortness of breath. He's had nothing for treatment of his symptoms today. Patient denies fevers, neck pain, back pain, numbness, tingling, weakness, abdominal pain, vomiting, diarrhea, loss of bladder control, loss of bowel control, double vision, headache, or loss of consciousness.   The history is provided by the patient and the spouse. No language interpreter was used.  Fall  Pertinent negatives include no chest pain, no abdominal pain, no headaches and no shortness of breath.    Past Medical History:  Diagnosis Date  . Hypertension   . Psoriasis    fingers  . Tooth infection    left lower wisdom tooth 05/23/2014 pt currently taking  amoxicillin     Patient Active Problem List   Diagnosis Date Noted  . Right groin pain 03/03/2014  . Pain of right upper arm 03/03/2014  . Infected nailbed of toe 07/14/2013  . Ingrown toenail 07/14/2013  . Routine general medical examination at a health care facility 04/17/2012  . HTN (hypertension) 01/27/2012  . Psoriasis 01/27/2012    Past Surgical History:  Procedure Laterality Date  . APPENDECTOMY  1980  . 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 broken shoulder blade bone  June 1990   (L)  . colonscopy      removed polyps 2014  . INGUINAL HERNIA REPAIR Bilateral 05/30/2014   Procedure: LAPAROSCOPIC INGUINAL HERNIA;  Surgeon: Michael Boston, MD;  Location: [facility];  Service: General;  Laterality: Bilateral;  . INSERTION OF MESH Bilateral 05/30/2014   Procedure: INSERTION OF MESH;  Surgeon: Michael Boston, MD;  Location: [facility];  Service: General;  Laterality: Bilateral;  . menisus tear repair  Sept. 2007   (R) knee  . UPPER GI ENDOSCOPY     pt states had on 10/14/2008; esophagus was also dilated        Home Medications    Prior to Admission medications   Medication Sig Start Date End Date Taking? Authorizing Provider  amLODipine (NORVASC) 10 MG tablet Take 1 tablet (10 mg total) by mouth daily. 05/03/15  Yes Midge Minium, MD  losartan-hydrochlorothiazide (HYZAAR) 100-12.5 MG tablet TAKE 1 TABLET DAILY  05/03/15  Yes Midge Minium, MD  SORIATANE 25 MG capsule Take 1 capsule by mouth every other day.  01/02/12  Yes Historical Provider, MD  naproxen (NAPROSYN) 250 MG tablet Take 1 tablet (250 mg total) by mouth 2 (two) times daily with a meal. 10/12/15   Waynetta Pean, PA-C    Family History Family History  Problem Relation Age of Onset  . Adopted: Yes  . Colon cancer Neg Hx     Social History Social History  Substance Use Topics  . Smoking status: Never Smoker  . Smokeless tobacco: Never Used  . Alcohol use 3.6 oz/week    6 Glasses of wine per week      Comment: occas glass of wine      Allergies   Review of patient's allergies indicates no known allergies.   Review of Systems Review of Systems  Constitutional: Negative for fever.  HENT: Negative for ear pain and nosebleeds.   Eyes: Negative for visual disturbance.  Respiratory: Negative for cough and shortness of breath.   Cardiovascular: Negative for chest pain.  Gastrointestinal: Negative for abdominal pain, diarrhea, nausea and vomiting.  Genitourinary: Negative for difficulty urinating and dysuria.  Musculoskeletal: Positive for arthralgias. Negative for back pain and neck pain.  Skin: Negative for rash and wound.  Neurological: Positive for light-headedness (resolved ). Negative for syncope, weakness, numbness and headaches.     Physical Exam Updated Vital Signs BP 160/93 (BP Location: Left Arm)   Pulse 66   Temp 98.2 F (36.8 C) (Oral)   Resp 13   Ht 6\' 3"  (1.905 m)   Wt 96.6 kg   SpO2 99%   BMI 26.62 kg/m   Physical Exam  Constitutional: He is oriented to person, place, and time. He appears well-developed and well-nourished. No distress.  Nontoxic appearing.  HENT:  Head: Normocephalic and atraumatic.  Right Ear: External ear normal.  Left Ear: External ear normal.  Mouth/Throat: Oropharynx is clear and moist.  No visible signs of head trauma.  Eyes: Conjunctivae and EOM are normal. Pupils are equal, round, and reactive to light. Right eye exhibits no discharge. Left eye exhibits no discharge.  Neck: Normal range of motion. Neck supple. No JVD present. No tracheal deviation present.  No midline neck tenderness. No crepitus or deformity. No tenderness to his neck.  Cardiovascular: Normal rate, regular rhythm, normal heart sounds and intact distal pulses.   Bilateral radial, posterior tibialis and dorsalis pedis pulses are intact.    Pulmonary/Chest: Effort normal and breath sounds normal. No stridor. No respiratory distress. He has no wheezes. He has no  rales. He exhibits no tenderness.  Lungs clear to auscultation bilaterally. Symmetric chest expansion bilaterally. No chest wall tenderness to palpation.  Abdominal: Soft. There is no tenderness. There is no guarding.  Musculoskeletal: Normal range of motion. He exhibits tenderness. He exhibits no edema or deformity.  Mild tenderness to his right posterior shoulder. No shoulder deformity, edema or ecchymosis. No clavicle tenderness bilaterally. Patient's bilateral elbow, wrist, hip, knee and ankle joints are supple and nontender to palpation. Good range of motion of his bilateral hips without pain. Good strength in his bilateral lower extremities. No midline neck or back tenderness. No back erythema, deformity or ecchymosis.  Lymphadenopathy:    He has no cervical adenopathy.  Neurological: He is alert and oriented to person, place, and time. No cranial nerve deficit. Coordination normal.  Patient is alert and oriented 3. Cranial nerves are intact. Speech is clear and  coherent. Vision is grossly intact. EOMs are intact bilaterally.  Skin: Skin is warm and dry. Capillary refill takes less than 2 seconds. No rash noted. He is not diaphoretic. No erythema. No pallor.  Psychiatric: He has a normal mood and affect. His behavior is normal.  Nursing note and vitals reviewed.    ED Treatments / Results  Labs (all labs ordered are listed, but only abnormal results are displayed) Labs Reviewed - No data to display  EKG  EKG Interpretation None       Radiology Dg Shoulder Right  Result Date: 10/12/2015 CLINICAL DATA:  55 year old who fell down approximately 15 steps earlier today and landed on his right side. Posterior right shoulder pain. Initial encounter. EXAM: RIGHT SHOULDER - 2+ VIEW COMPARISON:  None. FINDINGS: Possible avulsion fracture involving the posterior superior rim of the glenoid. No other fractures. Glenohumeral joint anatomically aligned. Acromioclavicular joint intact without  significant degenerative change. IMPRESSION: Possible avulsion fracture involving the posterior superior rim of the glenoid. Electronically Signed   By: Evangeline Dakin M.D.   On: 10/12/2015 14:07   Ct Head Wo Contrast  Result Date: 10/12/2015 CLINICAL DATA:  Fall down 15 steps. Right shoulder pain. Dizziness and nausea. Initial encounter. EXAM: CT HEAD WITHOUT CONTRAST TECHNIQUE: Contiguous axial images were obtained from the base of the skull through the vertex without intravenous contrast. COMPARISON:  None. FINDINGS: Brain: No evidence of acute infarction, hemorrhage, hydrocephalus, extra-axial collection or mass lesion/mass effect. Vascular: No acute finding Skull: Normal. Negative for fracture or focal lesion. Sinuses/Orbits: No acute finding. IMPRESSION: No evidence of intracranial injury or fracture. Electronically Signed   By: Monte Fantasia M.D.   On: 10/12/2015 13:46   Dg Hip Unilat With Pelvis 2-3 Views Right  Result Date: 10/12/2015 CLINICAL DATA:  Status post fall off of 15 steps today. Right-sided lateral hip pain. EXAM: DG HIP (WITH OR WITHOUT PELVIS) 2-3V RIGHT COMPARISON:  None. FINDINGS: There is no evidence of hip fracture or dislocation. There is no evidence of arthropathy or other focal bone abnormality. IMPRESSION: Negative. Electronically Signed   By: Fidela Salisbury M.D.   On: 10/12/2015 14:05    Procedures Procedures (including critical care time)  Medications Ordered in ED Medications  acetaminophen (TYLENOL) tablet 650 mg (650 mg Oral Given 10/12/15 1324)     Initial Impression / Assessment and Plan / ED Course  I have reviewed the triage vital signs and the nursing notes.  Pertinent labs & imaging results that were available during my care of the patient were reviewed by me and considered in my medical decision making (see chart for details).  Clinical Course   Patient presented to the emergency department after slipping fall down a flight of stairs. The  stairs were wet and he had a mechanical fall. He complained of pain to his right shoulder and his right hip. No chest pain or shortness of breath. He did have a period of lightheadedness, and sweatiness after this happened. These symptoms have resolved. Likely from anxiety after the incident. On exam the patient is afebrile nontoxic appearing. No visible signs of head injury or trauma. He has no focal neurological deficits. His C-spine is cleared by NEXUS criteria. He has some pain to the posterior aspect of his right shoulder. Good range of motion. He is neurovascularly intact. No midline neck or back tenderness. No tenderness or deformity noted to his right hip. Based on [place] head CT rules will obtain head CT as the patient fell down  a flight of stairs. We'll also obtain imaging of his right shoulder and his right hip. Head CT and right hip with pelvis are unremarkable. Right shoulder x-ray shows a possible avulsion fracture involving the posterior superior rim of the glenoid. I rechecked patient is feeling better. We'll place in a shoulder sling and have him follow-up with orthopedic surgery. Naproxen for pain control. I discussed head injury precautions and head injury return precautions. I discussed signs and symptoms of concussion. I advised the patient to follow-up with their primary care provider this week. I advised the patient to return to the emergency department with new or worsening symptoms or new concerns. The patient verbalized understanding and agreement with plan.      Final Clinical Impressions(s) / ED Diagnoses   Final diagnoses:  Fall, initial encounter  Minor head injury, initial encounter  Glenoid fracture of shoulder, right, closed, initial encounter  Right hip pain    New Prescriptions New Prescriptions   NAPROXEN (NAPROSYN) 250 MG TABLET    Take 1 tablet (250 mg total) by mouth 2 (two) times daily with a meal.     Waynetta Pean, PA-C 10/12/15 [place], MD 10/12/15 1745

## 2015-10-12 NOTE — ED Notes (Signed)
Bed: [facility] Expected date:  Expected time:  Means of arrival:  Comments: EMS- 55yo M, concussion?/fall

## 2015-10-12 NOTE — ED Triage Notes (Addendum)
Per [facility] pt from had fall today, fell one flight of 16 steps, hit head , right shoulder pain, right hand tingling sensation. Co dizziness and nausea . No loc , no neck pain . Presents with C collar.  BP 130/91 HR 70 RR 16 CBG 143. Unremarkable EKG per EMS

## 2015-10-12 NOTE — Discharge Instructions (Signed)
Your shoulder x-ray today showed an avulsion fracture of your glenoid. Please follow up with orthopedic surgery.

## 2015-10-13 DIAGNOSIS — S43004A Unspecified dislocation of right shoulder joint, initial encounter: Secondary | ICD-10-CM | POA: Diagnosis not present

## 2015-10-27 DIAGNOSIS — S43004A Unspecified dislocation of right shoulder joint, initial encounter: Secondary | ICD-10-CM | POA: Diagnosis not present

## 2015-10-31 ENCOUNTER — Encounter: Payer: Self-pay | Admitting: Family Medicine

## 2015-10-31 ENCOUNTER — Ambulatory Visit (INDEPENDENT_AMBULATORY_CARE_PROVIDER_SITE_OTHER): Payer: BLUE CROSS/BLUE SHIELD | Admitting: Family Medicine

## 2015-10-31 VITALS — BP 124/84 | HR 65 | Temp 99.1°F | Resp 16 | Ht 75.0 in | Wt 213.2 lb

## 2015-10-31 DIAGNOSIS — I10 Essential (primary) hypertension: Secondary | ICD-10-CM | POA: Diagnosis not present

## 2015-10-31 DIAGNOSIS — Z23 Encounter for immunization: Secondary | ICD-10-CM

## 2015-10-31 DIAGNOSIS — M25511 Pain in right shoulder: Secondary | ICD-10-CM | POA: Diagnosis not present

## 2015-10-31 DIAGNOSIS — S43011D Anterior subluxation of right humerus, subsequent encounter: Secondary | ICD-10-CM | POA: Diagnosis not present

## 2015-10-31 LAB — CBC WITH DIFFERENTIAL/PLATELET
BASOS ABS: 0 10*3/uL (ref 0.0–0.1)
Basophils Relative: 0.7 % (ref 0.0–3.0)
EOS ABS: 0.3 10*3/uL (ref 0.0–0.7)
Eosinophils Relative: 4.9 % (ref 0.0–5.0)
HEMATOCRIT: 42.2 % (ref 39.0–52.0)
HEMOGLOBIN: 14.3 g/dL (ref 13.0–17.0)
LYMPHS PCT: 31 % (ref 12.0–46.0)
Lymphs Abs: 1.9 10*3/uL (ref 0.7–4.0)
MCHC: 33.8 g/dL (ref 30.0–36.0)
MCV: 94.2 fl (ref 78.0–100.0)
MONO ABS: 0.5 10*3/uL (ref 0.1–1.0)
Monocytes Relative: 8.5 % (ref 3.0–12.0)
Neutro Abs: 3.4 10*3/uL (ref 1.4–7.7)
Neutrophils Relative %: 54.9 % (ref 43.0–77.0)
Platelets: 265 10*3/uL (ref 150.0–400.0)
RBC: 4.48 Mil/uL (ref 4.22–5.81)
RDW: 12.6 % (ref 11.5–15.5)
WBC: 6.2 10*3/uL (ref 4.0–10.5)

## 2015-10-31 LAB — BASIC METABOLIC PANEL
BUN: 11 mg/dL (ref 6–23)
CO2: 29 mEq/L (ref 19–32)
CREATININE: 0.85 mg/dL (ref 0.40–1.50)
Calcium: 10 mg/dL (ref 8.4–10.5)
Chloride: 101 mEq/L (ref 96–112)
GFR: 99.26 mL/min (ref >60.00)
Glucose, Bld: 93 mg/dL (ref 70–99)
POTASSIUM: 3.9 meq/L (ref 3.5–5.1)
Sodium: 140 mEq/L (ref 135–145)

## 2015-10-31 NOTE — Progress Notes (Signed)
   Subjective:    Patient ID: Cailan Student, male    DOB: 1960/05/13, 55 y.o.   MRN: [id number]  HPI HTN- chronic problem, on Amlodipine and Losartan HCTZ daily w/ good control.  Exercising regularly.  No CP, SOB, HAs, visual changes, N/V, abd pain, edema.   Review of Systems For ROS see HPI     Objective:   Physical Exam  Constitutional: He is oriented to person, place, and time. He appears well-developed and well-nourished. No distress.  HENT:  Head: Normocephalic and atraumatic.  Eyes: Conjunctivae and EOM are normal. Pupils are equal, round, and reactive to light.  Neck: Normal range of motion. Neck supple. No thyromegaly present.  Cardiovascular: Normal rate, regular rhythm, normal heart sounds and intact distal pulses.   No murmur heard. Pulmonary/Chest: Effort normal and breath sounds normal. No respiratory distress.  Abdominal: Soft. Bowel sounds are normal. He exhibits no distension.  Musculoskeletal: He exhibits no edema.  Lymphadenopathy:    He has no cervical adenopathy.  Neurological: He is alert and oriented to person, place, and time. No cranial nerve deficit.  Skin: Skin is warm and dry.  Psychiatric: He has a normal mood and affect. His behavior is normal.  Vitals reviewed.         Assessment & Plan:

## 2015-10-31 NOTE — Assessment & Plan Note (Signed)
Chronic problem.  Adequate control.  Asymptomatic at this time.  Check labs.  No anticipated med changes.  Will follow. 

## 2015-10-31 NOTE — Progress Notes (Signed)
Pre visit review using our clinic review tool, if applicable. No additional management support is needed unless otherwise documented below in the visit note. 

## 2015-10-31 NOTE — Patient Instructions (Signed)
Schedule your complete physical in 6 months We'll notify you of your lab results and make any changes if needed Keep up the good work on healthy diet and regular exercise- you look great!! Call with any questions or concerns Happy Fall!!! 

## 2015-11-02 DIAGNOSIS — S43011D Anterior subluxation of right humerus, subsequent encounter: Secondary | ICD-10-CM | POA: Diagnosis not present

## 2015-11-02 DIAGNOSIS — M25511 Pain in right shoulder: Secondary | ICD-10-CM | POA: Diagnosis not present

## 2015-11-07 DIAGNOSIS — M25511 Pain in right shoulder: Secondary | ICD-10-CM | POA: Diagnosis not present

## 2015-11-07 DIAGNOSIS — S43011D Anterior subluxation of right humerus, subsequent encounter: Secondary | ICD-10-CM | POA: Diagnosis not present

## 2015-11-08 DIAGNOSIS — S43011D Anterior subluxation of right humerus, subsequent encounter: Secondary | ICD-10-CM | POA: Diagnosis not present

## 2015-11-08 DIAGNOSIS — M25511 Pain in right shoulder: Secondary | ICD-10-CM | POA: Diagnosis not present

## 2015-11-09 ENCOUNTER — Other Ambulatory Visit: Payer: Self-pay | Admitting: Family Medicine

## 2015-11-13 DIAGNOSIS — S43011D Anterior subluxation of right humerus, subsequent encounter: Secondary | ICD-10-CM | POA: Diagnosis not present

## 2015-11-13 DIAGNOSIS — M25511 Pain in right shoulder: Secondary | ICD-10-CM | POA: Diagnosis not present

## 2015-11-15 DIAGNOSIS — S43011D Anterior subluxation of right humerus, subsequent encounter: Secondary | ICD-10-CM | POA: Diagnosis not present

## 2015-11-15 DIAGNOSIS — M25511 Pain in right shoulder: Secondary | ICD-10-CM | POA: Diagnosis not present

## 2015-11-20 DIAGNOSIS — M25511 Pain in right shoulder: Secondary | ICD-10-CM | POA: Diagnosis not present

## 2015-11-20 DIAGNOSIS — S43011D Anterior subluxation of right humerus, subsequent encounter: Secondary | ICD-10-CM | POA: Diagnosis not present

## 2015-11-22 DIAGNOSIS — M25511 Pain in right shoulder: Secondary | ICD-10-CM | POA: Diagnosis not present

## 2015-11-22 DIAGNOSIS — S43011D Anterior subluxation of right humerus, subsequent encounter: Secondary | ICD-10-CM | POA: Diagnosis not present

## 2015-11-27 DIAGNOSIS — S43011D Anterior subluxation of right humerus, subsequent encounter: Secondary | ICD-10-CM | POA: Diagnosis not present

## 2015-11-27 DIAGNOSIS — M25511 Pain in right shoulder: Secondary | ICD-10-CM | POA: Diagnosis not present

## 2015-11-29 DIAGNOSIS — S43011D Anterior subluxation of right humerus, subsequent encounter: Secondary | ICD-10-CM | POA: Diagnosis not present

## 2015-11-29 DIAGNOSIS — M25511 Pain in right shoulder: Secondary | ICD-10-CM | POA: Diagnosis not present

## 2015-12-01 DIAGNOSIS — H52223 Regular astigmatism, bilateral: Secondary | ICD-10-CM | POA: Diagnosis not present

## 2015-12-01 DIAGNOSIS — H5203 Hypermetropia, bilateral: Secondary | ICD-10-CM | POA: Diagnosis not present

## 2015-12-01 DIAGNOSIS — H524 Presbyopia: Secondary | ICD-10-CM | POA: Diagnosis not present

## 2015-12-05 DIAGNOSIS — M25511 Pain in right shoulder: Secondary | ICD-10-CM | POA: Diagnosis not present

## 2015-12-05 DIAGNOSIS — S43011D Anterior subluxation of right humerus, subsequent encounter: Secondary | ICD-10-CM | POA: Diagnosis not present

## 2015-12-06 DIAGNOSIS — S43011D Anterior subluxation of right humerus, subsequent encounter: Secondary | ICD-10-CM | POA: Diagnosis not present

## 2015-12-06 DIAGNOSIS — M25511 Pain in right shoulder: Secondary | ICD-10-CM | POA: Diagnosis not present

## 2015-12-13 DIAGNOSIS — S43011D Anterior subluxation of right humerus, subsequent encounter: Secondary | ICD-10-CM | POA: Diagnosis not present

## 2015-12-13 DIAGNOSIS — M25511 Pain in right shoulder: Secondary | ICD-10-CM | POA: Diagnosis not present

## 2015-12-18 DIAGNOSIS — M25511 Pain in right shoulder: Secondary | ICD-10-CM | POA: Diagnosis not present

## 2015-12-18 DIAGNOSIS — S43011D Anterior subluxation of right humerus, subsequent encounter: Secondary | ICD-10-CM | POA: Diagnosis not present

## 2015-12-20 DIAGNOSIS — M25511 Pain in right shoulder: Secondary | ICD-10-CM | POA: Diagnosis not present

## 2015-12-20 DIAGNOSIS — S43011D Anterior subluxation of right humerus, subsequent encounter: Secondary | ICD-10-CM | POA: Diagnosis not present

## 2015-12-25 DIAGNOSIS — M25511 Pain in right shoulder: Secondary | ICD-10-CM | POA: Diagnosis not present

## 2015-12-25 DIAGNOSIS — S43011D Anterior subluxation of right humerus, subsequent encounter: Secondary | ICD-10-CM | POA: Diagnosis not present

## 2015-12-27 DIAGNOSIS — S43011D Anterior subluxation of right humerus, subsequent encounter: Secondary | ICD-10-CM | POA: Diagnosis not present

## 2015-12-27 DIAGNOSIS — M25511 Pain in right shoulder: Secondary | ICD-10-CM | POA: Diagnosis not present

## 2016-01-01 DIAGNOSIS — M25511 Pain in right shoulder: Secondary | ICD-10-CM | POA: Diagnosis not present

## 2016-01-01 DIAGNOSIS — S43011D Anterior subluxation of right humerus, subsequent encounter: Secondary | ICD-10-CM | POA: Diagnosis not present

## 2016-01-03 DIAGNOSIS — M25511 Pain in right shoulder: Secondary | ICD-10-CM | POA: Diagnosis not present

## 2016-01-03 DIAGNOSIS — S43011D Anterior subluxation of right humerus, subsequent encounter: Secondary | ICD-10-CM | POA: Diagnosis not present

## 2016-01-10 DIAGNOSIS — S43011D Anterior subluxation of right humerus, subsequent encounter: Secondary | ICD-10-CM | POA: Diagnosis not present

## 2016-01-10 DIAGNOSIS — M25511 Pain in right shoulder: Secondary | ICD-10-CM | POA: Diagnosis not present

## 2016-01-11 DIAGNOSIS — S43011D Anterior subluxation of right humerus, subsequent encounter: Secondary | ICD-10-CM | POA: Diagnosis not present

## 2016-01-11 DIAGNOSIS — M25511 Pain in right shoulder: Secondary | ICD-10-CM | POA: Diagnosis not present

## 2016-05-07 ENCOUNTER — Encounter: Payer: Self-pay | Admitting: Family Medicine

## 2016-05-07 ENCOUNTER — Ambulatory Visit (INDEPENDENT_AMBULATORY_CARE_PROVIDER_SITE_OTHER): Payer: BLUE CROSS/BLUE SHIELD | Admitting: Family Medicine

## 2016-05-07 VITALS — BP 122/82 | HR 66 | Temp 98.1°F | Resp 17 | Ht 75.0 in | Wt 221.5 lb

## 2016-05-07 DIAGNOSIS — M67431 Ganglion, right wrist: Secondary | ICD-10-CM

## 2016-05-07 DIAGNOSIS — Z0000 Encounter for general adult medical examination without abnormal findings: Secondary | ICD-10-CM | POA: Diagnosis not present

## 2016-05-07 LAB — CBC WITH DIFFERENTIAL/PLATELET
BASOS ABS: 0.1 10*3/uL (ref 0.0–0.1)
Basophils Relative: 1.3 % (ref 0.0–3.0)
EOS ABS: 0.2 10*3/uL (ref 0.0–0.7)
Eosinophils Relative: 3.7 % (ref 0.0–5.0)
HCT: 43.1 % (ref 39.0–52.0)
Hemoglobin: 14.5 g/dL (ref 13.0–17.0)
Lymphocytes Relative: 33 % (ref 12.0–46.0)
Lymphs Abs: 1.6 10*3/uL (ref 0.7–4.0)
MCHC: 33.7 g/dL (ref 30.0–36.0)
MCV: 95.1 fl (ref 78.0–100.0)
MONO ABS: 0.6 10*3/uL (ref 0.1–1.0)
Monocytes Relative: 12.8 % — ABNORMAL HIGH (ref 3.0–12.0)
NEUTROS ABS: 2.4 10*3/uL (ref 1.4–7.7)
NEUTROS PCT: 49.2 % (ref 43.0–77.0)
PLATELETS: 258 10*3/uL (ref 150.0–400.0)
RBC: 4.54 Mil/uL (ref 4.22–5.81)
RDW: 12.8 % (ref 11.5–15.5)
WBC: 4.9 10*3/uL (ref 4.0–10.5)

## 2016-05-07 LAB — LIPID PANEL
Cholesterol: 209 mg/dL — ABNORMAL HIGH (ref 0–200)
HDL: 72.3 mg/dL (ref >39.00)
LDL Cholesterol: 121 mg/dL — ABNORMAL HIGH (ref 0–99)
NONHDL: 136.76
Total CHOL/HDL Ratio: 3
Triglycerides: 80 mg/dL (ref 0.0–149.0)
VLDL: 16 mg/dL (ref 0.0–40.0)

## 2016-05-07 LAB — BASIC METABOLIC PANEL
BUN: 13 mg/dL (ref 6–23)
CHLORIDE: 102 meq/L (ref 96–112)
CO2: 30 meq/L (ref 19–32)
Calcium: 9.9 mg/dL (ref 8.4–10.5)
Creatinine, Ser: 0.84 mg/dL (ref 0.40–1.50)
GFR: 100.44 mL/min (ref >60.00)
Glucose, Bld: 97 mg/dL (ref 70–99)
Potassium: 4 mEq/L (ref 3.5–5.1)
SODIUM: 138 meq/L (ref 135–145)

## 2016-05-07 LAB — PSA: PSA: 0.67 ng/mL (ref 0.10–4.00)

## 2016-05-07 LAB — HEPATIC FUNCTION PANEL
ALK PHOS: 75 U/L (ref 39–117)
ALT: 16 U/L (ref 0–53)
AST: 15 U/L (ref 0–37)
Albumin: 4.6 g/dL (ref 3.5–5.2)
BILIRUBIN DIRECT: 0.2 mg/dL (ref 0.0–0.3)
TOTAL PROTEIN: 7.2 g/dL (ref 6.0–8.3)
Total Bilirubin: 1.4 mg/dL — ABNORMAL HIGH (ref 0.2–1.2)

## 2016-05-07 NOTE — Progress Notes (Signed)
Pre visit review using our clinic review tool, if applicable. No additional management support is needed unless otherwise documented below in the visit note. 

## 2016-05-07 NOTE — Assessment & Plan Note (Signed)
Pt's PE WNL.  UTD on colonoscopy, Tdap.  Check labs.  Anticipatory guidance provided.  

## 2016-05-07 NOTE — Progress Notes (Signed)
   Subjective:    Patient ID: Richard Ware, male    DOB: 03-21-1960, 56 y.o.   MRN: [id number]  HPI CPE- UTD on colonoscopy, Tdap.    R ganglion cyst- needs referral to hand specialist  Review of Systems Patient reports no vision/hearing changes, anorexia, fever ,adenopathy, persistant/recurrent hoarseness, swallowing issues, chest pain, palpitations, edema, persistant/recurrent cough, hemoptysis, dyspnea (rest,exertional, paroxysmal nocturnal), gastrointestinal  bleeding (melena, rectal bleeding), abdominal pain, excessive heart burn, GU symptoms (dysuria, hematuria, voiding/incontinence issues) syncope, focal weakness, memory loss, numbness & tingling, skin/hair/nail changes, depression, anxiety, abnormal bruising/bleeding, musculoskeletal symptoms/signs.     Objective:   Physical Exam BP 122/82   Pulse 66   Temp 98.1 F (36.7 C) (Oral)   Resp 17   Ht 6\' 3"  (1.905 m)   Wt 221 lb 8 oz (100.5 kg)   SpO2 98%   BMI 27.69 kg/m   General Appearance:    Alert, cooperative, no distress, appears stated age  Head:    Normocephalic, without obvious abnormality, atraumatic  Eyes:    PERRL, conjunctiva/corneas clear, EOM's intact, fundi    benign, both eyes       Ears:    Normal TM's and external ear canals, both ears  Nose:   Nares normal, septum midline, mucosa normal, no drainage   or sinus tenderness  Throat:   Lips, mucosa, and tongue normal; teeth and gums normal  Neck:   Supple, symmetrical, trachea midline, no adenopathy;       thyroid:  No enlargement/tenderness/nodules  Back:     Symmetric, no curvature, ROM normal, no CVA tenderness  Lungs:     Clear to auscultation bilaterally, respirations unlabored  Chest wall:    No tenderness or deformity  Heart:    Regular rate and rhythm, S1 and S2 normal, no murmur, rub   or gallop  Abdomen:     Soft, non-tender, bowel sounds active all four quadrants,    no masses, no organomegaly  Genitalia:    Normal male without lesion,  discharge or tenderness  Rectal:    Normal tone, normal prostate, no masses or tenderness  Extremities:   Extremities normal, atraumatic, no cyanosis or edema.  Ganglion cyst on dorsum of R hand  Pulses:   2+ and symmetric all extremities  Skin:   Skin color, texture, turgor normal, no rashes or lesions  Lymph nodes:   Cervical, supraclavicular, and axillary nodes normal  Neurologic:   CNII-XII intact. Normal strength, sensation and reflexes      throughout          Assessment & Plan:

## 2016-05-07 NOTE — Patient Instructions (Signed)
Follow up in 6 months to recheck BP We'll notify you of your la results and make any changes if needed Continue to work on healthy diet and regular exercise- you can do it! Call with any questions or concerns Happy Spring!!!

## 2016-05-10 ENCOUNTER — Other Ambulatory Visit: Payer: Self-pay | Admitting: Family Medicine

## 2016-08-15 DIAGNOSIS — L409 Psoriasis, unspecified: Secondary | ICD-10-CM | POA: Diagnosis not present

## 2016-08-15 DIAGNOSIS — L821 Other seborrheic keratosis: Secondary | ICD-10-CM | POA: Diagnosis not present

## 2016-08-15 DIAGNOSIS — L814 Other melanin hyperpigmentation: Secondary | ICD-10-CM | POA: Diagnosis not present

## 2016-08-15 DIAGNOSIS — D1800 Hemangioma unspecified site: Secondary | ICD-10-CM | POA: Diagnosis not present

## 2016-08-15 DIAGNOSIS — Z79899 Other long term (current) drug therapy: Secondary | ICD-10-CM | POA: Diagnosis not present

## 2016-08-26 DIAGNOSIS — M75101 Unspecified rotator cuff tear or rupture of right shoulder, not specified as traumatic: Secondary | ICD-10-CM | POA: Diagnosis not present

## 2016-08-26 DIAGNOSIS — M25511 Pain in right shoulder: Secondary | ICD-10-CM | POA: Diagnosis not present

## 2016-10-24 DIAGNOSIS — H60501 Unspecified acute noninfective otitis externa, right ear: Secondary | ICD-10-CM | POA: Diagnosis not present

## 2016-11-05 ENCOUNTER — Ambulatory Visit: Payer: Self-pay | Admitting: Family Medicine

## 2016-11-11 ENCOUNTER — Other Ambulatory Visit: Payer: Self-pay | Admitting: Family Medicine

## 2016-11-18 ENCOUNTER — Encounter: Payer: Self-pay | Admitting: Family Medicine

## 2016-11-18 ENCOUNTER — Encounter: Payer: Self-pay | Admitting: General Practice

## 2016-11-18 ENCOUNTER — Ambulatory Visit (INDEPENDENT_AMBULATORY_CARE_PROVIDER_SITE_OTHER): Payer: BLUE CROSS/BLUE SHIELD | Admitting: Family Medicine

## 2016-11-18 VITALS — BP 120/81 | HR 65 | Temp 98.8°F | Resp 16 | Ht 75.0 in | Wt 222.2 lb

## 2016-11-18 DIAGNOSIS — I10 Essential (primary) hypertension: Secondary | ICD-10-CM

## 2016-11-18 LAB — BASIC METABOLIC PANEL
BUN: 14 mg/dL (ref 6–23)
CALCIUM: 9.5 mg/dL (ref 8.4–10.5)
CO2: 28 mEq/L (ref 19–32)
Chloride: 103 mEq/L (ref 96–112)
Creatinine, Ser: 0.8 mg/dL (ref 0.40–1.50)
GFR: 106.05 mL/min (ref >60.00)
Glucose, Bld: 98 mg/dL (ref 70–99)
POTASSIUM: 3.8 meq/L (ref 3.5–5.1)
Sodium: 139 mEq/L (ref 135–145)

## 2016-11-18 LAB — HEPATIC FUNCTION PANEL
ALBUMIN: 4.3 g/dL (ref 3.5–5.2)
ALK PHOS: 68 U/L (ref 39–117)
ALT: 15 U/L (ref 0–53)
AST: 14 U/L (ref 0–37)
Bilirubin, Direct: 0.2 mg/dL (ref 0.0–0.3)
TOTAL PROTEIN: 6.8 g/dL (ref 6.0–8.3)
Total Bilirubin: 1.4 mg/dL — ABNORMAL HIGH (ref 0.2–1.2)

## 2016-11-18 LAB — CBC WITH DIFFERENTIAL/PLATELET
BASOS ABS: 0 10*3/uL (ref 0.0–0.1)
Basophils Relative: 0.8 % (ref 0.0–3.0)
EOS PCT: 2.6 % (ref 0.0–5.0)
Eosinophils Absolute: 0.1 10*3/uL (ref 0.0–0.7)
HCT: 41.8 % (ref 39.0–52.0)
HEMOGLOBIN: 14.2 g/dL (ref 13.0–17.0)
LYMPHS ABS: 2.2 10*3/uL (ref 0.7–4.0)
Lymphocytes Relative: 41.8 % (ref 12.0–46.0)
MCHC: 33.9 g/dL (ref 30.0–36.0)
MCV: 94.9 fl (ref 78.0–100.0)
MONO ABS: 0.6 10*3/uL (ref 0.1–1.0)
Monocytes Relative: 11 % (ref 3.0–12.0)
Neutro Abs: 2.3 10*3/uL (ref 1.4–7.7)
Neutrophils Relative %: 43.8 % (ref 43.0–77.0)
PLATELETS: 206 10*3/uL (ref 150.0–400.0)
RBC: 4.41 Mil/uL (ref 4.22–5.81)
RDW: 12.3 % (ref 11.5–15.5)
WBC: 5.3 10*3/uL (ref 4.0–10.5)

## 2016-11-18 LAB — LIPID PANEL
CHOLESTEROL: 172 mg/dL (ref 0–200)
HDL: 58.1 mg/dL (ref >39.00)
LDL Cholesterol: 101 mg/dL — ABNORMAL HIGH (ref 0–99)
NONHDL: 114.1
Total CHOL/HDL Ratio: 3
Triglycerides: 64 mg/dL (ref 0.0–149.0)
VLDL: 12.8 mg/dL (ref 0.0–40.0)

## 2016-11-18 LAB — TSH: TSH: 1.06 u[IU]/mL (ref 0.35–4.50)

## 2016-11-18 NOTE — Patient Instructions (Signed)
Schedule your complete physical in 6 months We'll notify you of your lab results and make any changes if needed Keep up the good work on healthy diet and regular exercise- you look great!! Call with any questions or concerns Happy Fall!!! 

## 2016-11-18 NOTE — Assessment & Plan Note (Signed)
Chronic problem.  Adequate control.  Asymptomatic.  Applauded his efforts at healthy diet and regular exercise.  Check labs.  No anticipated med changes.  Will follow.

## 2016-11-18 NOTE — Progress Notes (Signed)
   Subjective:    Patient ID: Richard Ware, male    DOB: Jan 25, 1960, 56 y.o.   MRN: [id number]  HPI HTN- chronic problem.  On Losartan HCTZ and Amlodipine w/ good BP control.  Exercising regularly- golf, yoga, gym- daily.  No CP, SOB, HAs, visual changes, edema.   Review of Systems For ROS see HPI     Objective:   Physical Exam  Constitutional: He is oriented to person, place, and time. He appears well-developed and well-nourished. No distress.  HENT:  Head: Normocephalic and atraumatic.  Eyes: Conjunctivae and EOM are normal. Pupils are equal, round, and reactive to light.  Neck: Normal range of motion. Neck supple. No thyromegaly present.  Cardiovascular: Normal rate, regular rhythm, normal heart sounds and intact distal pulses.  No murmur heard. Pulmonary/Chest: Effort normal and breath sounds normal. No respiratory distress.  Abdominal: Soft. Bowel sounds are normal. He exhibits no distension.  Musculoskeletal: He exhibits no edema.  Lymphadenopathy:    He has no cervical adenopathy.  Neurological: He is alert and oriented to person, place, and time. No cranial nerve deficit.  Skin: Skin is warm and dry.  Psychiatric: He has a normal mood and affect. His behavior is normal.  Vitals reviewed.         Assessment & Plan:

## 2016-11-29 ENCOUNTER — Other Ambulatory Visit: Payer: Self-pay | Admitting: Family Medicine

## 2017-02-20 DIAGNOSIS — H0015 Chalazion left lower eyelid: Secondary | ICD-10-CM | POA: Diagnosis not present

## 2017-03-06 DIAGNOSIS — Z6828 Body mass index (BMI) 28.0-28.9, adult: Secondary | ICD-10-CM | POA: Diagnosis not present

## 2017-03-06 DIAGNOSIS — H00014 Hordeolum externum left upper eyelid: Secondary | ICD-10-CM | POA: Diagnosis not present

## 2017-05-05 ENCOUNTER — Other Ambulatory Visit: Payer: Self-pay | Admitting: Family Medicine

## 2017-05-20 DIAGNOSIS — Z125 Encounter for screening for malignant neoplasm of prostate: Secondary | ICD-10-CM | POA: Diagnosis not present

## 2017-05-20 DIAGNOSIS — I10 Essential (primary) hypertension: Secondary | ICD-10-CM | POA: Diagnosis not present

## 2017-05-20 DIAGNOSIS — Z79899 Other long term (current) drug therapy: Secondary | ICD-10-CM | POA: Diagnosis not present

## 2017-05-20 DIAGNOSIS — Z6827 Body mass index (BMI) 27.0-27.9, adult: Secondary | ICD-10-CM | POA: Diagnosis not present

## 2017-05-23 ENCOUNTER — Encounter: Payer: Self-pay | Admitting: Family Medicine

## 2017-06-17 DIAGNOSIS — I10 Essential (primary) hypertension: Secondary | ICD-10-CM | POA: Diagnosis not present

## 2017-06-17 DIAGNOSIS — Z0000 Encounter for general adult medical examination without abnormal findings: Secondary | ICD-10-CM | POA: Diagnosis not present

## 2017-06-17 DIAGNOSIS — Z6827 Body mass index (BMI) 27.0-27.9, adult: Secondary | ICD-10-CM | POA: Diagnosis not present

## 2017-06-17 DIAGNOSIS — Z125 Encounter for screening for malignant neoplasm of prostate: Secondary | ICD-10-CM | POA: Diagnosis not present

## 2017-09-30 DIAGNOSIS — L408 Other psoriasis: Secondary | ICD-10-CM | POA: Diagnosis not present

## 2017-09-30 DIAGNOSIS — D225 Melanocytic nevi of trunk: Secondary | ICD-10-CM | POA: Diagnosis not present

## 2017-09-30 DIAGNOSIS — L812 Freckles: Secondary | ICD-10-CM | POA: Diagnosis not present

## 2017-09-30 DIAGNOSIS — L570 Actinic keratosis: Secondary | ICD-10-CM | POA: Diagnosis not present

## 2017-12-16 DIAGNOSIS — H2513 Age-related nuclear cataract, bilateral: Secondary | ICD-10-CM | POA: Diagnosis not present

## 2017-12-16 DIAGNOSIS — H0288B Meibomian gland dysfunction left eye, upper and lower eyelids: Secondary | ICD-10-CM | POA: Diagnosis not present

## 2017-12-16 DIAGNOSIS — H0288A Meibomian gland dysfunction right eye, upper and lower eyelids: Secondary | ICD-10-CM | POA: Diagnosis not present

## 2017-12-27 DIAGNOSIS — H5213 Myopia, bilateral: Secondary | ICD-10-CM | POA: Diagnosis not present

## 2018-06-01 DIAGNOSIS — Z125 Encounter for screening for malignant neoplasm of prostate: Secondary | ICD-10-CM | POA: Diagnosis not present

## 2018-06-01 DIAGNOSIS — I10 Essential (primary) hypertension: Secondary | ICD-10-CM | POA: Diagnosis not present

## 2018-06-01 DIAGNOSIS — Z0000 Encounter for general adult medical examination without abnormal findings: Secondary | ICD-10-CM | POA: Diagnosis not present

## 2018-06-05 DIAGNOSIS — Z0001 Encounter for general adult medical examination with abnormal findings: Secondary | ICD-10-CM | POA: Diagnosis not present

## 2018-06-05 DIAGNOSIS — R739 Hyperglycemia, unspecified: Secondary | ICD-10-CM | POA: Diagnosis not present

## 2018-06-05 DIAGNOSIS — I10 Essential (primary) hypertension: Secondary | ICD-10-CM | POA: Diagnosis not present

## 2018-06-05 DIAGNOSIS — E785 Hyperlipidemia, unspecified: Secondary | ICD-10-CM | POA: Diagnosis not present

## 2018-06-15 DIAGNOSIS — R2689 Other abnormalities of gait and mobility: Secondary | ICD-10-CM | POA: Diagnosis not present

## 2018-06-15 DIAGNOSIS — M7601 Gluteal tendinitis, right hip: Secondary | ICD-10-CM | POA: Diagnosis not present

## 2018-06-17 DIAGNOSIS — R2689 Other abnormalities of gait and mobility: Secondary | ICD-10-CM | POA: Diagnosis not present

## 2018-06-17 DIAGNOSIS — M7601 Gluteal tendinitis, right hip: Secondary | ICD-10-CM | POA: Diagnosis not present

## 2018-06-19 DIAGNOSIS — M7601 Gluteal tendinitis, right hip: Secondary | ICD-10-CM | POA: Diagnosis not present

## 2018-06-19 DIAGNOSIS — R2689 Other abnormalities of gait and mobility: Secondary | ICD-10-CM | POA: Diagnosis not present

## 2018-06-22 DIAGNOSIS — M7601 Gluteal tendinitis, right hip: Secondary | ICD-10-CM | POA: Diagnosis not present

## 2018-06-22 DIAGNOSIS — R2689 Other abnormalities of gait and mobility: Secondary | ICD-10-CM | POA: Diagnosis not present

## 2018-06-24 DIAGNOSIS — M7601 Gluteal tendinitis, right hip: Secondary | ICD-10-CM | POA: Diagnosis not present

## 2018-06-24 DIAGNOSIS — R2689 Other abnormalities of gait and mobility: Secondary | ICD-10-CM | POA: Diagnosis not present

## 2018-06-26 DIAGNOSIS — M7601 Gluteal tendinitis, right hip: Secondary | ICD-10-CM | POA: Diagnosis not present

## 2018-06-26 DIAGNOSIS — R2689 Other abnormalities of gait and mobility: Secondary | ICD-10-CM | POA: Diagnosis not present

## 2018-06-29 DIAGNOSIS — M7601 Gluteal tendinitis, right hip: Secondary | ICD-10-CM | POA: Diagnosis not present

## 2018-06-29 DIAGNOSIS — R2689 Other abnormalities of gait and mobility: Secondary | ICD-10-CM | POA: Diagnosis not present

## 2018-07-01 DIAGNOSIS — R2689 Other abnormalities of gait and mobility: Secondary | ICD-10-CM | POA: Diagnosis not present

## 2018-07-01 DIAGNOSIS — M7601 Gluteal tendinitis, right hip: Secondary | ICD-10-CM | POA: Diagnosis not present

## 2018-07-15 DIAGNOSIS — Z86010 Personal history of colonic polyps: Secondary | ICD-10-CM | POA: Diagnosis not present

## 2018-07-15 DIAGNOSIS — R1310 Dysphagia, unspecified: Secondary | ICD-10-CM | POA: Diagnosis not present

## 2018-09-04 IMAGING — CR DG SHOULDER 2+V*R*
3 series · 3 of 3 positions shown · non-contrast
Comparison: None.

CLINICAL DATA: 55-year-old who fell down approximately 15 steps
earlier today and landed on his right side. Posterior right shoulder
pain. Initial encounter.

EXAM:
RIGHT SHOULDER - 2+ VIEW

[w shoulder external right]
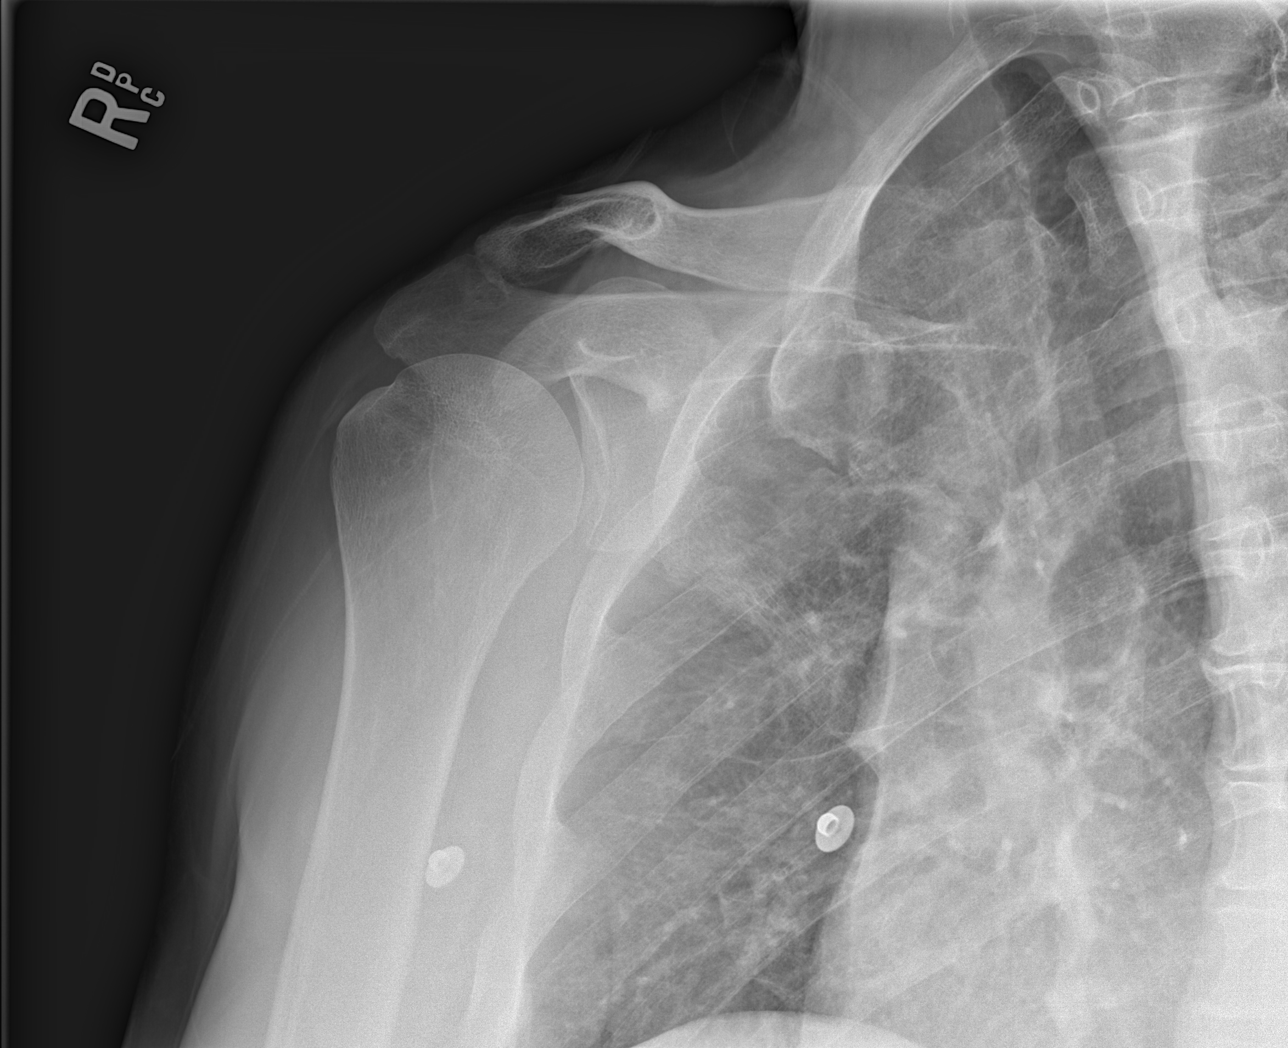

[w shoulder y-view right]
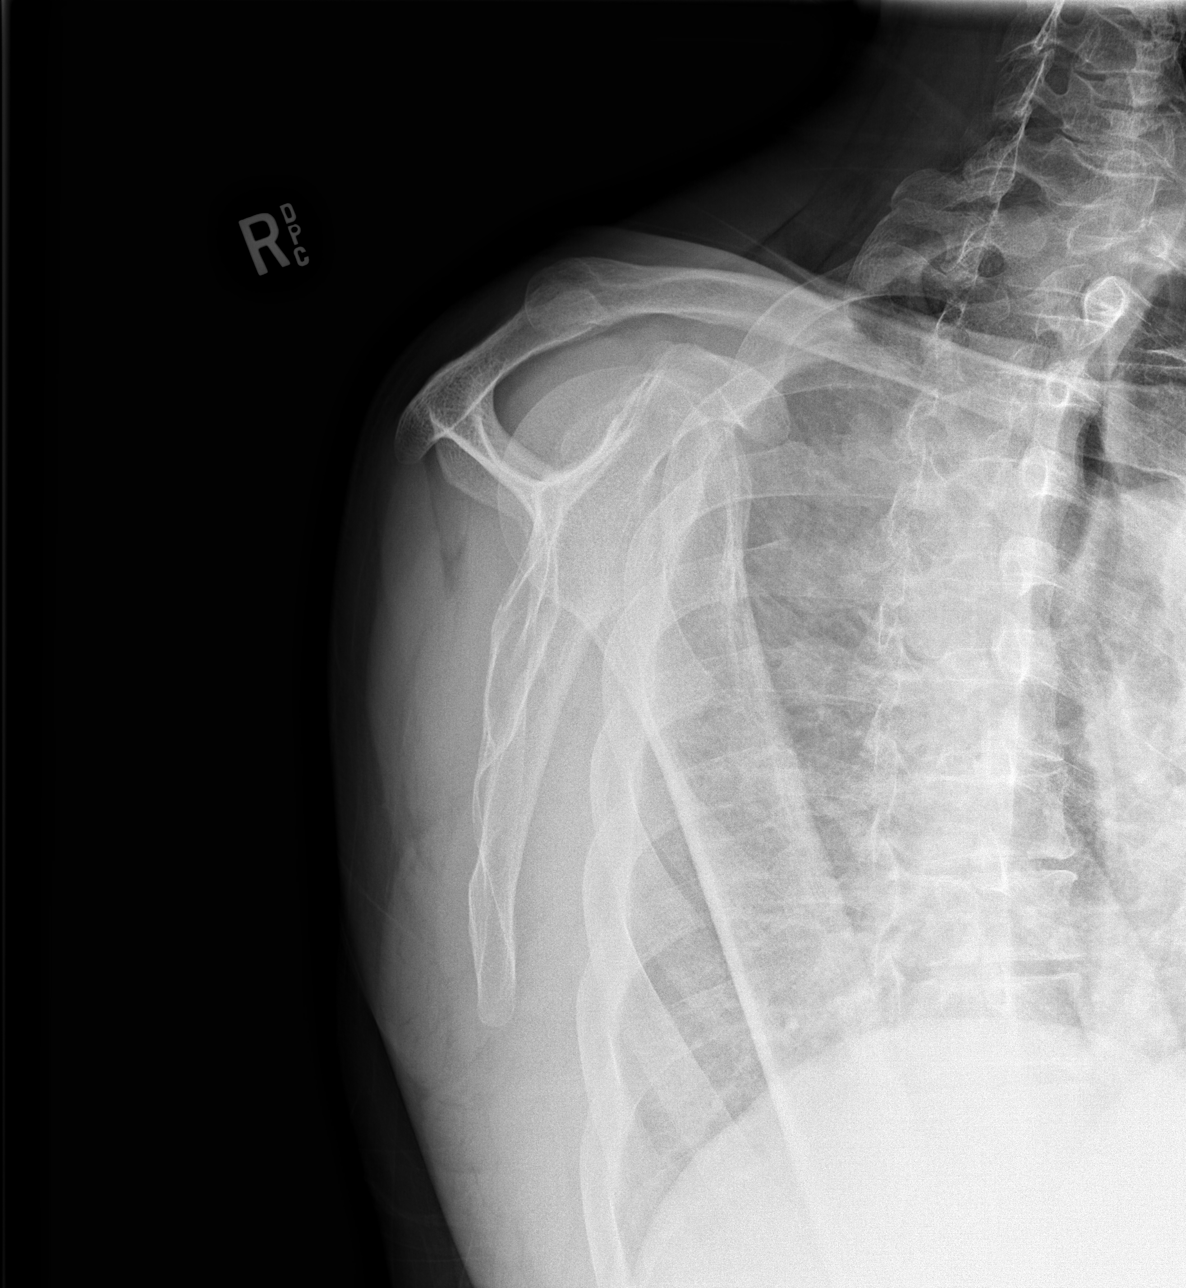

[x shoulder axillary right]
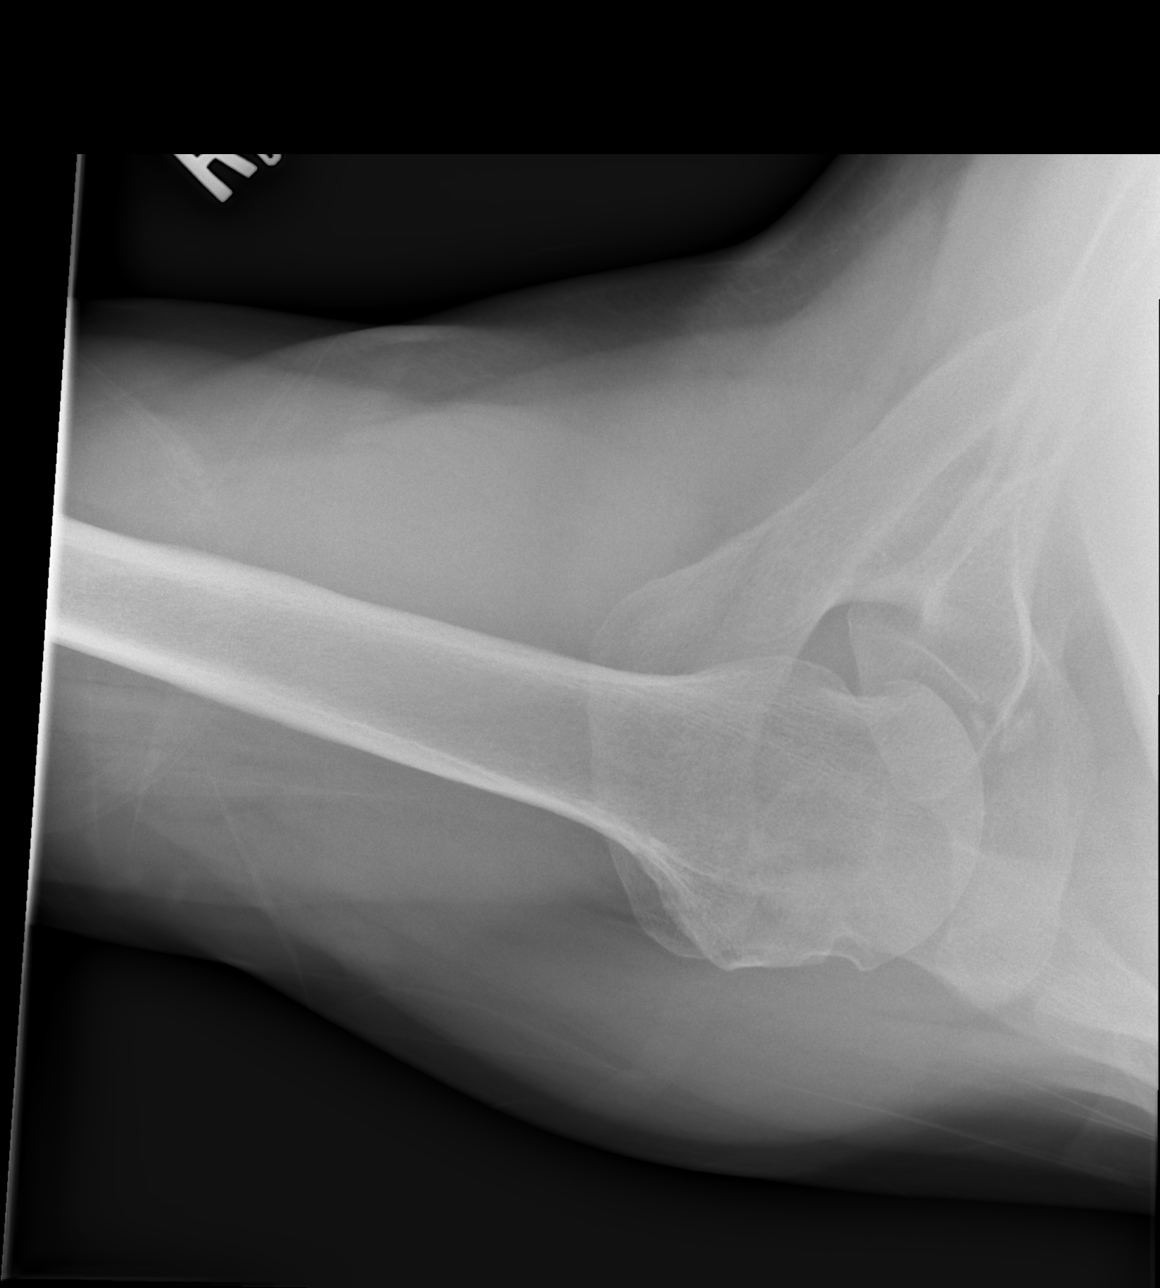

[3 of 3 positions shown; findings below may reference images not displayed]

FINDINGS: Possible avulsion fracture involving the posterior superior rim of
the glenoid. No other fractures. Glenohumeral joint anatomically
aligned. Acromioclavicular joint intact without significant
degenerative change.
IMPRESSION: Possible avulsion fracture involving the posterior superior rim of
the glenoid.

## 2018-09-04 IMAGING — CT CT HEAD W/O CM
3 of 4 series · 16 of 47 positions shown, 19 images · non-contrast
Comparison: None.

CLINICAL DATA: Fall down 15 steps. Right shoulder pain. Dizziness
and nausea. Initial encounter.

EXAM:
CT HEAD WITHOUT CONTRAST
TECHNIQUE: Contiguous axial images were obtained from the base of the skull
through the vertex without intravenous contrast.

[Series 4: coronal · coronal · 0.34mm/px · 3 of 70 slices shown]
[im 24/70  brain]
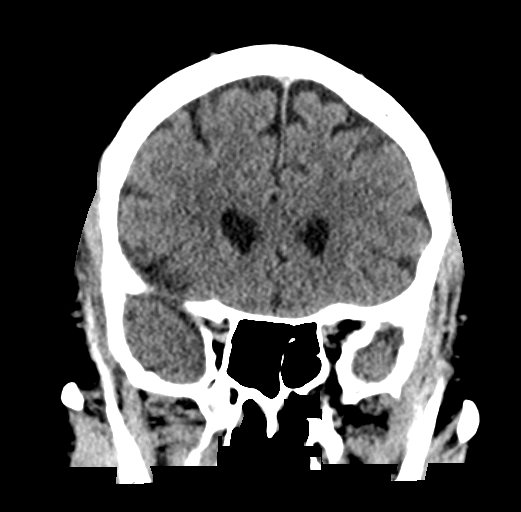
[im 31/70  brain]
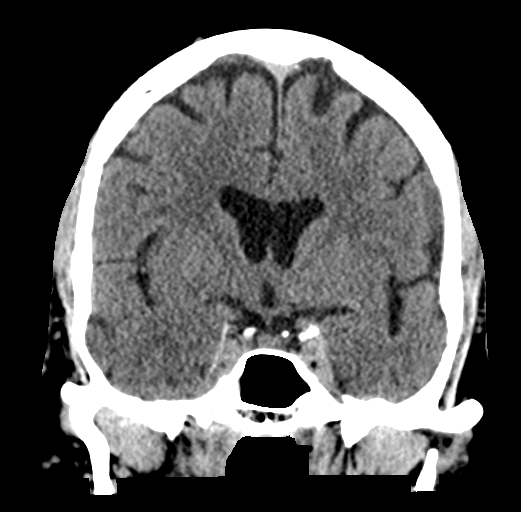
[im 39/70  brain]
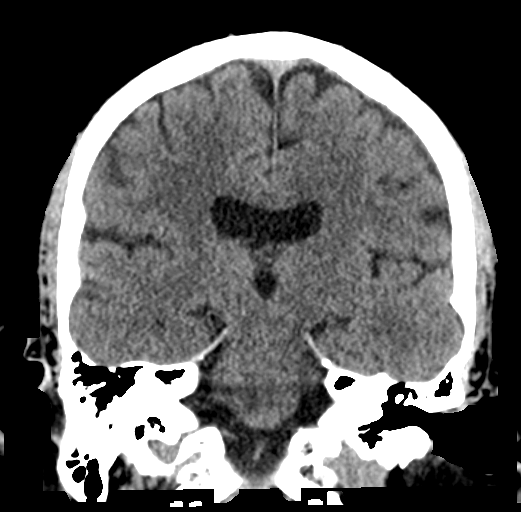

[Series 5: sagittal · sagittal · 0.34mm/px · 3 of 59 slices shown]
[im 20/59  brain]
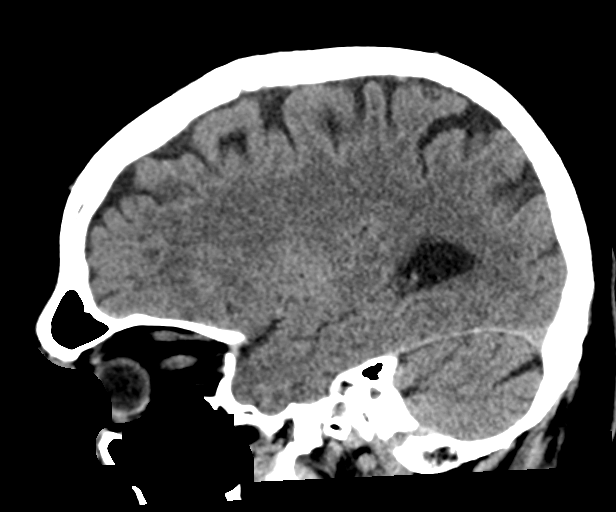
[im 30/59  brain]
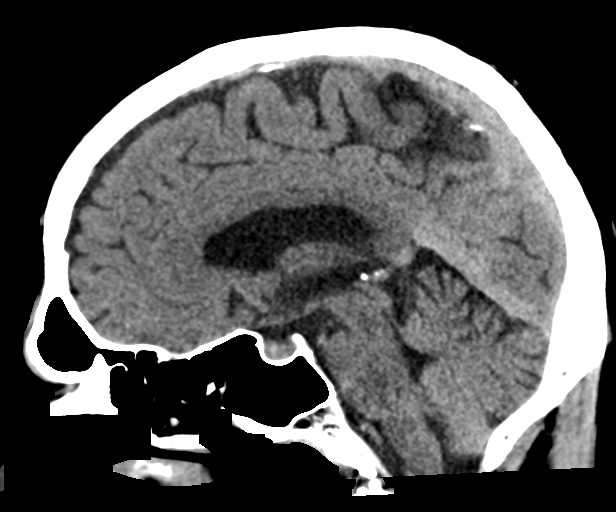
[im 39/59  brain]
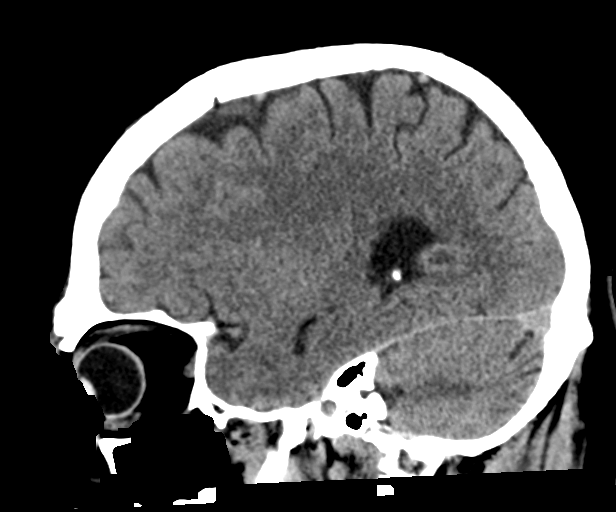

[Series 6: head w/o · axial · non-contrast · 0.45mm/px · z∈[+593,+738]mm · 10 of 35 slices shown, 13 images]
[im 3/35  brain]
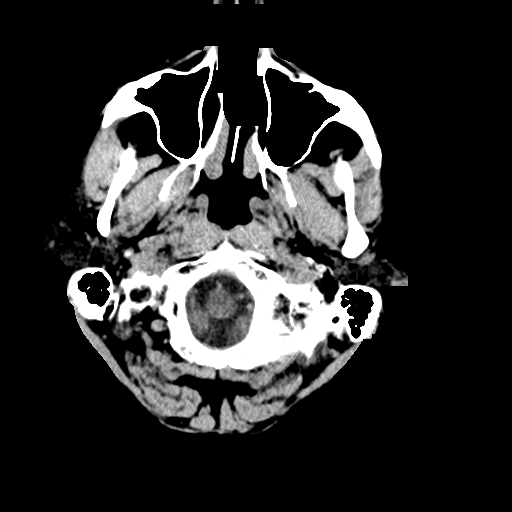
[im 3/35  bone]
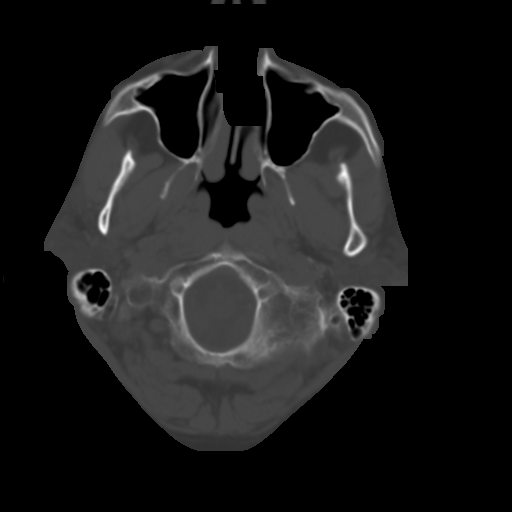
[im 5/35  brain]
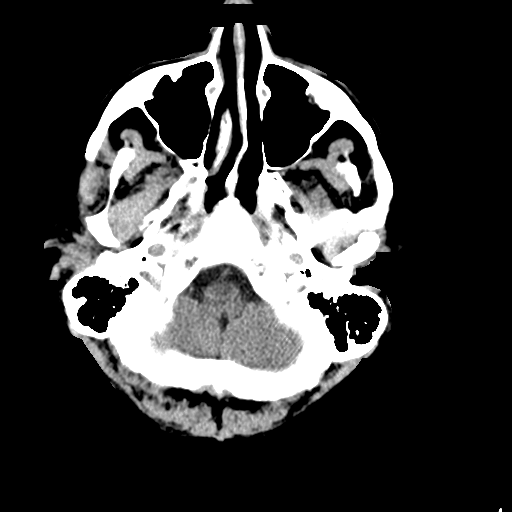
[im 10/35  brain]
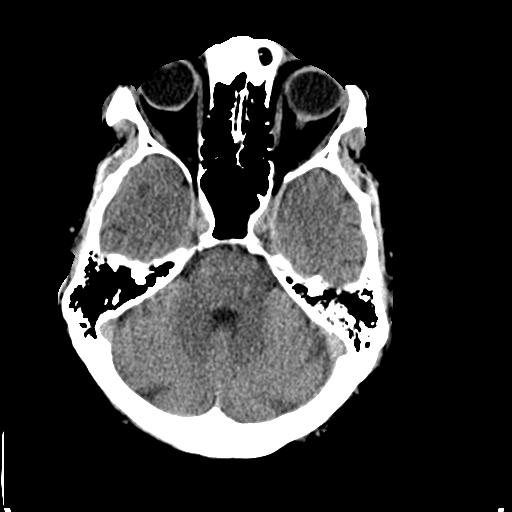
[im 13/35  brain]
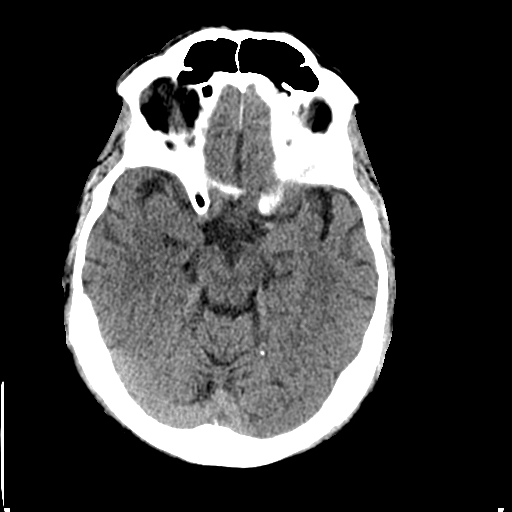
[im 15/35  brain]
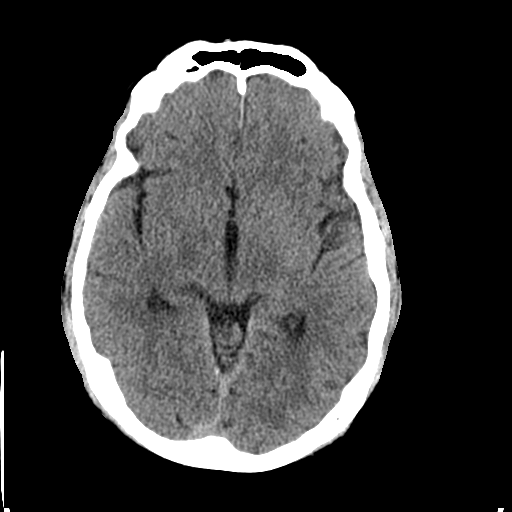
[im 15/35  bone]
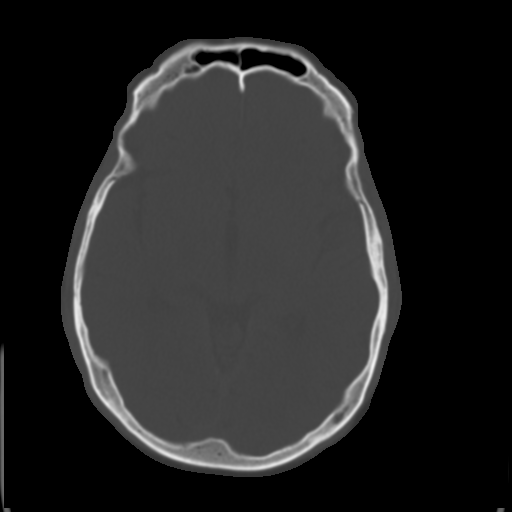
[im 20/35  brain]
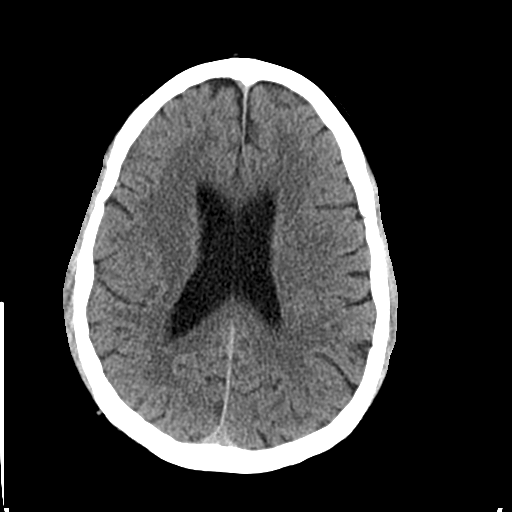
[im 22/35  brain]
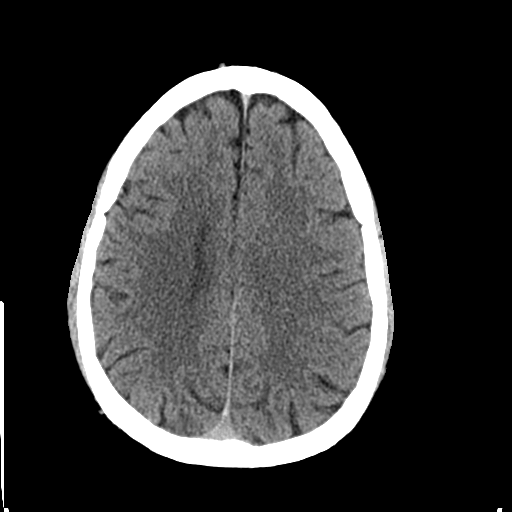
[im 25/35  brain]
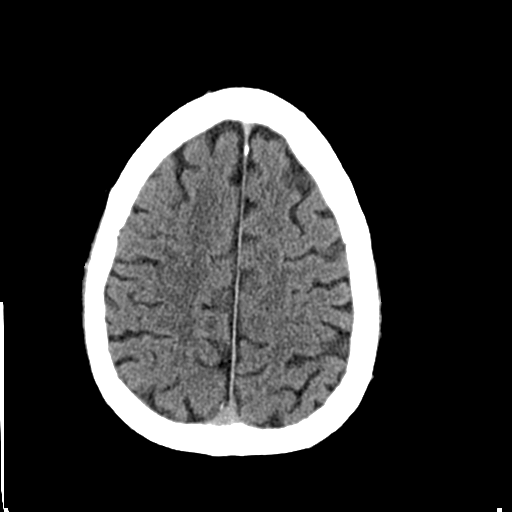
[im 30/35  brain]
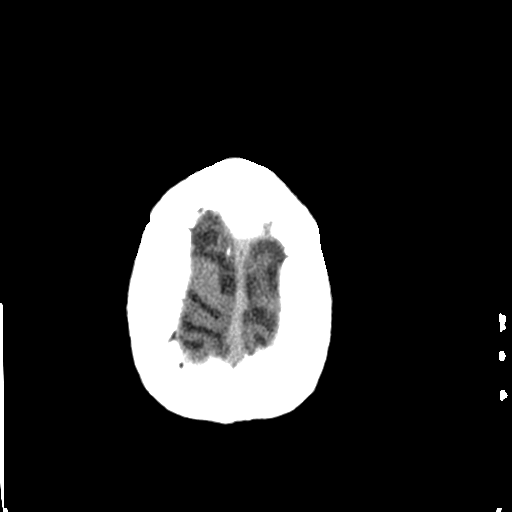
[im 30/35  bone]
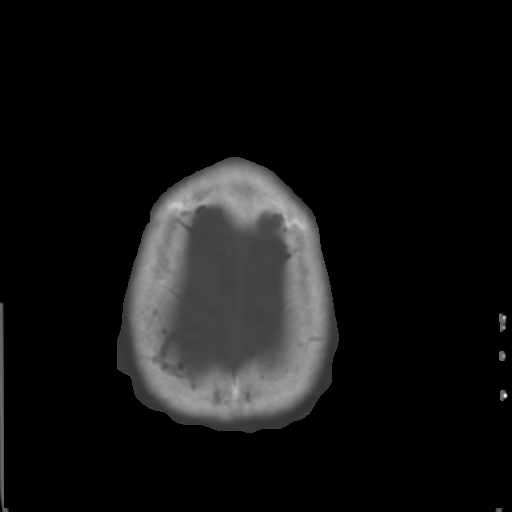
[im 32/35  brain]
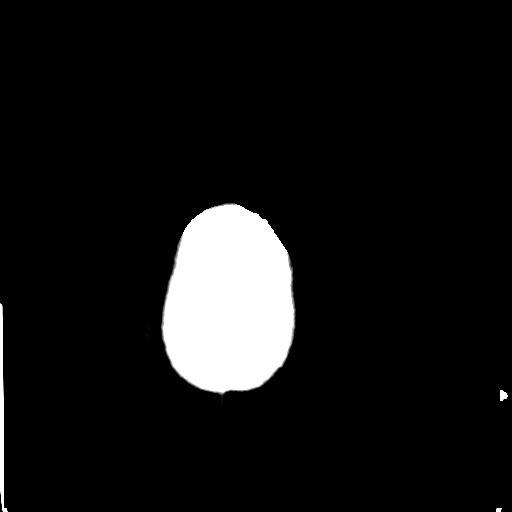

[16 of 47 positions shown; findings below may reference images not displayed]

FINDINGS: Brain: No evidence of acute infarction, hemorrhage, hydrocephalus,
extra-axial collection or mass lesion/mass effect.

Vascular: No acute finding

Skull: Normal. Negative for fracture or focal lesion.

Sinuses/Orbits: No acute finding.
IMPRESSION: No evidence of intracranial injury or fracture.

## 2018-09-05 ENCOUNTER — Encounter: Payer: Self-pay | Admitting: Gastroenterology

## 2018-10-28 DIAGNOSIS — K649 Unspecified hemorrhoids: Secondary | ICD-10-CM | POA: Diagnosis not present

## 2018-10-28 DIAGNOSIS — K635 Polyp of colon: Secondary | ICD-10-CM | POA: Diagnosis not present

## 2018-10-28 DIAGNOSIS — Z8719 Personal history of other diseases of the digestive system: Secondary | ICD-10-CM | POA: Diagnosis not present

## 2018-10-28 DIAGNOSIS — K648 Other hemorrhoids: Secondary | ICD-10-CM | POA: Diagnosis not present

## 2018-10-28 DIAGNOSIS — D122 Benign neoplasm of ascending colon: Secondary | ICD-10-CM | POA: Diagnosis not present

## 2018-10-28 DIAGNOSIS — K5790 Diverticulosis of intestine, part unspecified, without perforation or abscess without bleeding: Secondary | ICD-10-CM | POA: Diagnosis not present

## 2018-10-28 DIAGNOSIS — Z86010 Personal history of colonic polyps: Secondary | ICD-10-CM | POA: Diagnosis not present

## 2018-10-29 DIAGNOSIS — D1801 Hemangioma of skin and subcutaneous tissue: Secondary | ICD-10-CM | POA: Diagnosis not present

## 2018-10-29 DIAGNOSIS — L821 Other seborrheic keratosis: Secondary | ICD-10-CM | POA: Diagnosis not present

## 2018-10-29 DIAGNOSIS — L812 Freckles: Secondary | ICD-10-CM | POA: Diagnosis not present

## 2018-10-29 DIAGNOSIS — D225 Melanocytic nevi of trunk: Secondary | ICD-10-CM | POA: Diagnosis not present

## 2018-11-02 DIAGNOSIS — L408 Other psoriasis: Secondary | ICD-10-CM | POA: Diagnosis not present
